# Patient Record
Sex: Male | Born: 1970 | Race: White | Hispanic: No | Marital: Married | Smoking: Never smoker
Health system: Southern US, Community
[De-identification: ages and names within clinical notes are randomized; demographics above are authoritative.]

## PROBLEM LIST (undated history)

## (undated) DIAGNOSIS — K219 Gastro-esophageal reflux disease without esophagitis: Secondary | ICD-10-CM

## (undated) DIAGNOSIS — T7840XA Allergy, unspecified, initial encounter: Secondary | ICD-10-CM

## (undated) DIAGNOSIS — J302 Other seasonal allergic rhinitis: Secondary | ICD-10-CM

## (undated) HISTORY — DX: Allergy, unspecified, initial encounter: T78.40XA

## (undated) HISTORY — PX: NO PAST SURGERIES: SHX2092

## (undated) HISTORY — DX: Gastro-esophageal reflux disease without esophagitis: K21.9

---

## 2011-08-14 ENCOUNTER — Emergency Department
Admission: EM | Admit: 2011-08-14 | Discharge: 2011-08-14 | Disposition: A | Discharge status: Home / Self Care | Payer: Self-pay | Source: Home / Self Care | Attending: Family Medicine | Admitting: Family Medicine

## 2011-08-14 ENCOUNTER — Encounter: Payer: Self-pay | Admitting: Emergency Medicine

## 2011-08-14 DIAGNOSIS — J209 Acute bronchitis, unspecified: Secondary | ICD-10-CM

## 2011-08-14 MED ORDER — BENZONATATE 200 MG PO CAPS: 200.0000 mg | ORAL_CAPSULE | Freq: Every day | ORAL | Status: AC

## 2011-08-14 MED ORDER — CLARITHROMYCIN 500 MG PO TABS: 500.0000 mg | ORAL_TABLET | Freq: Two times a day (BID) | ORAL | Status: AC

## 2011-08-14 NOTE — Discharge Instructions (Signed)
Continue plain Mucinex (guaifenesin) for cough and congestion. May add Sudafed for nasal congestion.  Increase fluid intake, rest. May use Afrin nasal spray (or generic oxymetazoline) twice daily for about 5 days.  Also recommend using saline nasal spray several times daily and saline nasal irrigation (AYR is a common brand) Stop all antihistamines for now, and other non-prescription cough/cold preparations. Recommend a Tdap when well.  Follow-up with family doctor if not improving about 5 days.

## 2011-08-14 NOTE — ED Provider Notes (Signed)
History     CSN: [id number]  Arrival date & time 08/14/11  4098   First MD Initiated Contact with Patient 08/14/11 1055      Chief Complaint  Patient presents with  . Nasal Congestion  . Cough      HPI Comments: Patient complains of approximately 1.5 week history of gradually progressive URI symptoms beginning with a mild sore throat (now improved), followed by progressive nasal congestion.  A cough started about 7 days ago.  Complains of fatigue but no myalgias.  Cough is now worse at night and generally non-productive during the day.  There has been no pleuritic pain, shortness of breath, or wheezes.  He now continues to have fevers, chills, and sweats.  The history is provided by the patient.    History reviewed. No pertinent past medical history.  History reviewed. No pertinent past surgical history.  Family History  Problem Relation Age of Onset  . Heart failure Mother   . Heart failure Father   . Heart failure Brother     History  Substance Use Topics  . Smoking status: Never Smoker   . Smokeless tobacco: Not on file  . Alcohol Use: No      Review of Systems + sore throat + hoarsemess + cough No pleuritic pain No wheezing + nasal congestion + post-nasal drainage No sinus pain/pressure No itchy/red eyes No earache No hemoptysis No SOB + fever, + chills No nausea No vomiting No abdominal pain No diarrhea No urinary symptoms No skin rashes + fatigue No myalgias + headache Used OTC meds without relief  Allergies  Review of patient's allergies indicates no known allergies.  Home Medications   Current Outpatient Rx  Name Route Sig Dispense Refill  . BENZONATATE 200 MG PO CAPS Oral Take 1 capsule (200 mg total) by mouth at bedtime. Take as needed for cough 12 capsule 0  . CLARITHROMYCIN 500 MG PO TABS Oral Take 1 tablet (500 mg total) by mouth 2 (two) times daily. Take for one week 14 tablet 0    BP 113/80  Pulse 72  Temp(Src) 100 F (37.8  C) (Oral)  Resp 18  Ht 5\' 9"  (1.753 m)  Wt 197 lb (89.359 kg)  BMI 29.09 kg/m2  SpO2 98%  Physical Exam Nursing notes and Vital Signs reviewed. Appearance:  Patient appears healthy, stated age, and in no acute distress Eyes:  Pupils are equal, round, and reactive to light and accomodation.  Extraocular movement is intact.  Conjunctivae are not inflamed  Ears:  Canals normal.  Tympanic membranes normal.  Nose:  Mildly congested turbinates.  No sinus tenderness.    Pharynx:  Normal Neck:  Supple.  Slightly tender shotty posterior nodes are palpated bilaterally  Lungs:  Clear to auscultation.  Breath sounds are equal.  Heart:  Regular rate and rhythm without murmurs, rubs, or gallops.  Abdomen:  Nontender without masses or hepatosplenomegaly.  Bowel sounds are present.  No CVA or flank tenderness.  Extremities:  No edema.  No calf tenderness Skin:  No rash present.   ED Course  Procedures none      1. Acute bronchitis       MDM  Begin Biaxin, and Tessalon at bedtime.  Continue plain Mucinex (guaifenesin) for cough and congestion. May add Sudafed for nasal congestion.  Increase fluid intake, rest. May use Afrin nasal spray (or generic oxymetazoline) twice daily for about 5 days.  Also recommend using saline nasal spray several times daily and saline  nasal irrigation (AYR is a common brand) Stop all antihistamines for now, and other non-prescription cough/cold preparations. Recommend a Tdap when well.  Follow-up with family doctor if not improving about 5 days.       Donna Christen, MD 08/16/11 1719

## 2011-08-14 NOTE — ED Notes (Signed)
Only taking Mucinex.

## 2011-08-14 NOTE — ED Notes (Signed)
Congestion and cough with fever x 4 days. No Flu vaccination this season.

## 2012-09-13 ENCOUNTER — Emergency Department
Admission: EM | Admit: 2012-09-13 | Discharge: 2012-09-13 | Disposition: A | Discharge status: Home / Self Care | Payer: BC Managed Care – PPO | Source: Home / Self Care | Attending: Family Medicine | Admitting: Family Medicine

## 2012-09-13 ENCOUNTER — Encounter: Payer: Self-pay | Admitting: *Deleted

## 2012-09-13 DIAGNOSIS — J069 Acute upper respiratory infection, unspecified: Secondary | ICD-10-CM

## 2012-09-13 HISTORY — DX: Other seasonal allergic rhinitis: J30.2

## 2012-09-13 MED ORDER — AMOXICILLIN 875 MG PO TABS: 875.0000 mg | ORAL_TABLET | Freq: Two times a day (BID) | ORAL | Status: DC

## 2012-09-13 MED ORDER — PREDNISONE 20 MG PO TABS: 20.0000 mg | ORAL_TABLET | Freq: Two times a day (BID) | ORAL | Status: DC

## 2012-09-13 NOTE — ED Provider Notes (Signed)
History     CSN: [id number]  Arrival date & time 09/13/12  1351   First MD Initiated Contact with Patient 09/13/12 1450      Chief Complaint  Patient presents with  . Sinus Problem      HPI Comments: The patient has a history of seasonal rhinitis.  About 1.5 weeks ago he developed increased sinus congestion that was followed by a mild sore throat.  He has been fatigued.  About 4 days ago he developed a mild productive cough.  His sinus congestion is worse at night.  The history is provided by the patient and the spouse.    Past Medical History  Diagnosis Date  . Seasonal allergies     History reviewed. No pertinent past surgical history.  Family History  Problem Relation Age of Onset  . Heart failure Mother   . Heart failure Father   . Heart failure Brother     History  Substance Use Topics  . Smoking status: Never Smoker   . Smokeless tobacco: Never Used  . Alcohol Use: No      Review of Systems + sore throat, resolved + cough No pleuritic pain No wheezing + nasal congestion + post-nasal drainage + sinus pain/pressure No itchy/red eyes No earache No hemoptysis No SOB No fever/chills No nausea No vomiting No abdominal pain No diarrhea No urinary symptoms No skin rashes + fatigue No myalgias No headache Used OTC meds without relief  Allergies  Review of patient's allergies indicates no known allergies.  Home Medications   Current Outpatient Rx  Name  Route  Sig  Dispense  Refill  . cetirizine (ZYRTEC) 10 MG tablet   Oral   Take 10 mg by mouth daily.         . ranitidine (ZANTAC) 150 MG tablet   Oral   Take 150 mg by mouth 2 (two) times daily.         Marland Kitchen amoxicillin (AMOXIL) 875 MG tablet   Oral   Take 1 tablet (875 mg total) by mouth 2 (two) times daily.   20 tablet   0   . predniSONE (DELTASONE) 20 MG tablet   Oral   Take 1 tablet (20 mg total) by mouth 2 (two) times daily. Take with food.   10 tablet   0     BP 122/78   Pulse 73  Temp(Src) 98.2 F (36.8 C) (Oral)  Resp 14  Ht 5\' 9"  (1.753 m)  Wt 188 lb (85.276 kg)  BMI 27.75 kg/m2  SpO2 98%  Physical Exam Nursing notes and Vital Signs reviewed. Appearance:  Patient appears healthy, stated age, and in no acute distress Eyes:  Pupils are equal, round, and reactive to light and accomodation.  Extraocular movement is intact.  Conjunctivae are not inflamed  Ears:  Canals normal.  Tympanic membranes normal.  Nose:  Mildly congested turbinates.   Maxillary sinus tenderness is present.  Pharynx:  Normal Neck:  Supple.  Slightly tender shotty posterior nodes are palpated bilaterally  Lungs:  Clear to auscultation.  Breath sounds are equal.  Heart:  Regular rate and rhythm without murmurs, rubs, or gallops.  Abdomen:  Nontender without masses or hepatosplenomegaly.  Bowel sounds are present.  No CVA or flank tenderness.  Extremities:  No edema.  No calf tenderness Skin:  No rash present.   ED Course  Procedures  none      1. Acute upper respiratory infections of unspecified site; suspect secondary bacterial sinusitis  MDM  Begin prednisone burst, and amoxicillin for 10 days. Take Mucinex D (guaifenesin with decongestant) twice daily for congestion.  Increase fluid intake, rest. May use Afrin nasal spray (or generic oxymetazoline) twice daily for about 5 days.  Also recommend using saline nasal spray several times daily and saline nasal irrigation (AYR is a common brand) Stop all antihistamines for now, and other non-prescription cough/cold preparations. May take Delsym Cough Suppressant at bedtime for nighttime cough.  Follow-up with family doctor if not improving 7 to 10 days.         Lattie Haw, MD 09/13/12 [phone]

## 2012-09-13 NOTE — ED Notes (Signed)
Brent Roth c/o sinus drainage and congestion, sneezing x 1 1/2 weeks. Taken tylenol sinus and alka seltzer sinus otc. Denies fever. He takes a zyrtec daily. He recently switched to Claritin, this is when his symptoms started.

## 2014-06-19 ENCOUNTER — Encounter: Payer: Self-pay | Admitting: *Deleted

## 2014-06-19 ENCOUNTER — Emergency Department (INDEPENDENT_AMBULATORY_CARE_PROVIDER_SITE_OTHER)
Admission: EM | Admit: 2014-06-19 | Discharge: 2014-06-19 | Disposition: A | Discharge status: Home / Self Care | Payer: Managed Care, Other (non HMO) | Source: Home / Self Care | Attending: Family Medicine | Admitting: Family Medicine

## 2014-06-19 DIAGNOSIS — J0100 Acute maxillary sinusitis, unspecified: Secondary | ICD-10-CM

## 2014-06-19 LAB — POCT RAPID STREP A (OFFICE): Rapid Strep A Screen: NEGATIVE

## 2014-06-19 MED ORDER — AMOXICILLIN 875 MG PO TABS: 875.0000 mg | ORAL_TABLET | Freq: Two times a day (BID) | ORAL | Status: DC

## 2014-06-19 NOTE — ED Notes (Signed)
Pt c/o nasal congestion, post nasal drip, and swelling in throat x 1 wk. Denies fever.

## 2014-06-19 NOTE — ED Provider Notes (Signed)
CSN: [id number]     Arrival date & time 06/19/14  1329 History   First MD Initiated Contact with Patient 06/19/14 1358     Chief Complaint  Patient presents with  . Nasal Congestion      HPI Comments: Patient developed sinus congestion about two weeks ago but did not feel ill.  Over the past week he has had increased post-nasal drainage, sinus pressure, facial pressure, and mild sore throat.  No cough.  No fevers, chills, and sweats   The history is provided by the patient.    Past Medical History  Diagnosis Date  . Seasonal allergies    History reviewed. No pertinent past surgical history. Family History  Problem Relation Age of Onset  . Heart failure Mother   . Heart failure Father   . Heart failure Brother    History  Substance Use Topics  . Smoking status: Former Games developermoker  . Smokeless tobacco: Never Used  . Alcohol Use: Yes    Review of Systems + sore throat No cough No pleuritic pain No wheezing + nasal congestion + post-nasal drainage + sinus pain/pressure No itchy/red eyes No earache No hemoptysis No SOB No fever/chills No nausea No vomiting No abdominal pain No diarrhea No urinary symptoms No skin rash No fatigue No myalgias No headache Used OTC meds without relief  Allergies  Review of patient's allergies indicates no known allergies.  Home Medications   Prior to Admission medications   Medication Sig Start Date End Date Taking? Authorizing Provider  amoxicillin (AMOXIL) 875 MG tablet Take 1 tablet (875 mg total) by mouth 2 (two) times daily. 06/19/14   Lattie HawStephen A Javion Holmer, MD  cetirizine (ZYRTEC) 10 MG tablet Take 10 mg by mouth daily.    Historical Provider, MD  ranitidine (ZANTAC) 150 MG tablet Take 150 mg by mouth 2 (two) times daily.    Historical Provider, MD   BP 127/70 mmHg  Pulse 72  Temp(Src) 98.1 F (36.7 C) (Oral)  Resp 16  Ht 5\' 9"  (1.753 m)  Wt 185 lb (83.915 kg)  BMI 27.31 kg/m2  SpO2 100% Physical Exam Nursing notes and Vital  Signs reviewed. Appearance:  Patient appears stated age, and in no acute distress Eyes:  Pupils are equal, round, and reactive to light and accomodation.  Extraocular movement is intact.  Conjunctivae are not inflamed  Ears:  Canals normal.  Tympanic membranes normal.  Nose:  Congested turbinates.  Maxillary sinus tenderness is present.  Pharynx:  Normal Neck:  Supple.  No adenopathy Lungs:  Clear to auscultation.  Breath sounds are equal.  Heart:  Regular rate and rhythm without murmurs, rubs, or gallops.  Abdomen:  Nontender without masses or hepatosplenomegaly.  Bowel sounds are present.  No CVA or flank tenderness.  Extremities:  No edema.  No calf tenderness Skin:  No rash present.   ED Course  Procedures  None   Labs Reviewed  POCT RAPID STREP A (OFFICE) negative      MDM   1. Acute maxillary sinusitis, recurrence not specified    Begin amoxicillin 875mg  BID Take plain Mucinex (1200 mg guaifenesin) twice daily for cough and congestion.  Add Pseudoephedrine for sinus congestion.   Increase fluid intake, rest. May use Afrin nasal spray (or generic oxymetazoline) twice daily for about 5 days.  Also recommend using saline nasal spray several times daily and saline nasal irrigation (AYR is a common brand) Try warm salt water gargles for sore throat.  Stop all antihistamines for  now, and other non-prescription cough/cold preparations. May take Ibuprofen , 4 tabs every 8 hours with food for sore throat. Followup with Family Doctor if not improved in 10 days.     Lattie Haw, MD 06/22/14 [phone]

## 2014-06-19 NOTE — Discharge Instructions (Signed)
Take plain Mucinex (1200 mg guaifenesin) twice daily for cough and congestion.  Add Pseudoephedrine for sinus congestion.   Increase fluid intake, rest. May use Afrin nasal spray (or generic oxymetazoline) twice daily for about 5 days.  Also recommend using saline nasal spray several times daily and saline nasal irrigation (AYR is a common brand) Try warm salt water gargles for sore throat.  Stop all antihistamines for now, and other non-prescription cough/cold preparations. May take Ibuprofen 200mg , 4 tabs every 8 hours with food for sore throat.

## 2014-06-25 ENCOUNTER — Telehealth: Payer: Self-pay | Admitting: Emergency Medicine

## 2014-06-25 MED ORDER — AZITHROMYCIN 250 MG PO TABS: ORAL_TABLET | ORAL | Status: DC

## 2014-08-10 ENCOUNTER — Encounter: Payer: Self-pay | Admitting: *Deleted

## 2014-08-10 ENCOUNTER — Emergency Department (INDEPENDENT_AMBULATORY_CARE_PROVIDER_SITE_OTHER)
Admission: EM | Admit: 2014-08-10 | Discharge: 2014-08-10 | Disposition: A | Discharge status: Home / Self Care | Payer: Managed Care, Other (non HMO) | Source: Home / Self Care | Attending: Family Medicine | Admitting: Family Medicine

## 2014-08-10 DIAGNOSIS — J029 Acute pharyngitis, unspecified: Secondary | ICD-10-CM | POA: Diagnosis not present

## 2014-08-10 MED ORDER — AZITHROMYCIN 250 MG PO TABS
ORAL_TABLET | ORAL | Status: DC
Start: 2014-08-10 — End: 2014-08-14

## 2014-08-10 NOTE — ED Notes (Signed)
Pt complains of sore throat described as feeling swollen and L ear pain for 5 days.  Pt has been exposed to strep by 2 kids in the last week.  Says he has a little bit of congestion.

## 2014-08-10 NOTE — ED Provider Notes (Signed)
CSN: [id number]     Arrival date & time 08/10/14  [id number] History   First MD Initiated Contact with Patient 08/10/14 1013     Chief Complaint  Patient presents with  . Otalgia    L  . Sore Throat      HPI Comments: Patient complains of mild sore throat, sinus congestion, and mild left earache for about five days. His son and a young friend have had strep throat.  The history is provided by the patient.    Past Medical History  Diagnosis Date  . Seasonal allergies    History reviewed. No pertinent past surgical history. Family History  Problem Relation Age of Onset  . Heart failure Mother   . Heart failure Father   . Heart failure Brother    History  Substance Use Topics  . Smoking status: Former Games developermoker  . Smokeless tobacco: Never Used  . Alcohol Use: Yes    Review of Systems + mild sore throat No cough No pleuritic pain No wheezing + nasal congestion + post-nasal drainage No sinus pain/pressure No itchy/red eyes ? Left earache No hemoptysis No SOB No fever/chills No nausea No vomiting No abdominal pain No diarrhea No urinary symptoms No skin rash No fatigue No myalgias No headache Used OTC meds without relief  Allergies  Amoxicillin  Home Medications   Prior to Admission medications   Medication Sig Start Date End Date Taking? Authorizing Provider  azithromycin (ZITHROMAX Z-PAK) 250 MG tablet Take 2 tablets on day one, then 1 tablet daily on days 2 through 5 (Rx void after 08/17/14) 08/10/14   Lattie HawStephen A Aldwin Micalizzi, MD  cetirizine (ZYRTEC) 10 MG tablet Take 10 mg by mouth daily.    Historical Provider, MD  ranitidine (ZANTAC) 150 MG tablet Take 150 mg by mouth 2 (two) times daily.    Historical Provider, MD   BP 125/84 mmHg  Pulse 63  Temp(Src) 98 F (36.7 C) (Oral)  Ht 5\' 11"  (1.803 m)  Wt 188 lb 8 oz (85.503 kg)  BMI 26.30 kg/m2  SpO2 99% Physical Exam Nursing notes and Vital Signs reviewed. Appearance:  Patient appears stated age, and in no acute  distress Eyes:  Pupils are equal, round, and reactive to light and accomodation.  Extraocular movement is intact.  Conjunctivae are not inflamed  Ears:  Canals normal.  Tympanic membranes normal.  Nose:  Mildly congested turbinates.  No sinus tenderness.    Pharynx:  Minimal erythema Neck:  Supple.  No adenopathy Lungs:  Clear to auscultation.  Breath sounds are equal.  Heart:  Regular rate and rhythm without murmurs, rubs, or gallops.  Abdomen:  Nontender without masses or hepatosplenomegaly.  Bowel sounds are present.  No CVA or flank tenderness.  Extremities:  No edema.  No calf tenderness Skin:  No rash present.   ED Course  Procedures  None    Labs Reviewed  STREP A DNA PROBE  POCT RAPID STREP A (OFFICE) negative         MDM   1. Acute pharyngitis, unspecified pharyngitis type; suspect early viral URI    Throat culture pending. If throat culture is positive, begin Azithromycin (Given a prescription to hold, with an expiration date)    If cold symptoms develop, begin the following: Take plain guaifenesin (600mg  or1200mg  extended release tabs such as Mucinex) twice daily, with plenty of water, for cough and congestion.  May add Pseudoephedrine for sinus congestion.  Get adequate rest.   May use Afrin nasal  spray (or generic oxymetazoline) twice daily for about 5 days.  Also recommend using saline nasal spray several times daily and saline nasal irrigation (AYR is a common brand).  Use Flonase nasal spray each morning after using Afrin nasal spray and saline nasal irrigation. Try warm salt water gargles for sore throat.  Stop all antihistamines for now, and other non-prescription cough/cold preparations.   Follow-up with family doctor if not improving about10 days.     Lattie Haw, MD 08/10/14 2029

## 2014-08-10 NOTE — Discharge Instructions (Signed)
If throat culture is positive, begin Azithromycin   If cold symptoms develop, begin the following: Take plain guaifenesin (600mg  or1200mg  extended release tabs such as Mucinex) twice daily, with plenty of water, for cough and congestion.  May add Pseudoephedrine for sinus congestion.  Get adequate rest.   May use Afrin nasal spray (or generic oxymetazoline) twice daily for about 5 days.  Also recommend using saline nasal spray several times daily and saline nasal irrigation (AYR is a common brand).  Use Flonase nasal spray each morning after using Afrin nasal spray and saline nasal irrigation. Try warm salt water gargles for sore throat.  Stop all antihistamines for now, and other non-prescription cough/cold preparations.   Follow-up with family doctor if not improving about10 days.    Salt Water Gargle This solution will help make your mouth and throat feel better. HOME CARE INSTRUCTIONS   Mix 1 teaspoon of salt in 8 ounces of warm water.  Gargle with this solution as much or often as you need or as directed. Swish and gargle gently if you have any sores or wounds in your mouth.  Do not swallow this mixture. Document Released: 03/04/2004 Document Revised: 08/23/2011 Document Reviewed: 07/26/2008 [facility] Patient Information 2015 [employer], [place]. This information is not intended to replace advice given to you by your health care provider. Make sure you discuss any questions you have with your health care provider.

## 2014-08-11 LAB — STREP A DNA PROBE: GASP: NEGATIVE

## 2014-08-14 ENCOUNTER — Emergency Department (INDEPENDENT_AMBULATORY_CARE_PROVIDER_SITE_OTHER): Payer: Managed Care, Other (non HMO)

## 2014-08-14 ENCOUNTER — Encounter: Payer: Self-pay | Admitting: *Deleted

## 2014-08-14 ENCOUNTER — Emergency Department (INDEPENDENT_AMBULATORY_CARE_PROVIDER_SITE_OTHER)
Admission: EM | Admit: 2014-08-14 | Discharge: 2014-08-14 | Disposition: A | Discharge status: Home / Self Care | Payer: Managed Care, Other (non HMO) | Source: Home / Self Care | Attending: Family Medicine | Admitting: Family Medicine

## 2014-08-14 DIAGNOSIS — M25521 Pain in right elbow: Secondary | ICD-10-CM

## 2014-08-14 DIAGNOSIS — R0789 Other chest pain: Secondary | ICD-10-CM

## 2014-08-14 DIAGNOSIS — M940 Chondrocostal junction syndrome [Tietze]: Secondary | ICD-10-CM

## 2014-08-14 MED ORDER — MELOXICAM 15 MG PO TABS
15.0000 mg | ORAL_TABLET | Freq: Every day | ORAL | Status: DC
Start: 2014-08-14 — End: 2015-03-24

## 2014-08-14 NOTE — ED Notes (Signed)
Pt c/o RT elbow pain x 1-2 mths. Denies swelling, pain, or redness.

## 2014-08-14 NOTE — Discharge Instructions (Signed)
Apply ice pack to sternum for 15 to 20 minutes, 3 to 4 times daily  Continue until pain decreases.

## 2014-08-14 NOTE — ED Provider Notes (Signed)
CSN: [id number]     Arrival date & time 08/14/14  1003 History   First MD Initiated Contact with Patient 08/14/14 1111     Chief Complaint  Patient presents with  . Arm Pain      HPI Comments: Patient presents with two complaints: 1)  He has had pain in his right elbow with movement for one month.  He does not recall injury. 2)  He has had pain in his right upper anterior chest for about a month worse with chest movement and inspiration.  He denies cough.  No fevers, chills, and sweats.  No history of injury  Patient is a 44 y.o. male presenting with arm injury and chest pain. The history is provided by the patient.  Arm Injury Location:  Elbow Time since incident:  1 month Injury: no   Elbow location:  R elbow Pain details:    Quality:  Aching   Radiates to:  Does not radiate   Severity:  Mild   Onset quality:  Gradual   Duration:  1 month   Timing:  Constant   Progression:  Unchanged Chronicity:  New Handedness:  Right-handed Prior injury to area:  No Relieved by:  Nothing Worsened by:  Movement Ineffective treatments:  None tried Associated symptoms: no back pain, no decreased range of motion, no fever, no muscle weakness, no numbness, no stiffness, no swelling and no tingling   Chest Pain Pain location:  Substernal area Pain quality: aching   Pain radiates to:  Does not radiate Pain radiates to the back: no   Pain severity:  Mild Onset quality:  Gradual Duration:  1 month Timing:  Intermittent Progression:  Unchanged Chronicity:  New Context: breathing, lifting, movement and at rest   Relieved by:  Nothing Worsened by:  Coughing, movement and deep breathing Ineffective treatments:  None tried Associated symptoms: no abdominal pain, no back pain, no cough, no diaphoresis, no dysphagia, no fever, no heartburn, no nausea, no palpitations and no shortness of breath     Past Medical History  Diagnosis Date  . Seasonal allergies    History reviewed. No pertinent  past surgical history. Family History  Problem Relation Age of Onset  . Heart failure Mother   . Heart failure Father   . Heart failure Brother    History  Substance Use Topics  . Smoking status: Former Games developermoker  . Smokeless tobacco: Never Used  . Alcohol Use: Yes    Review of Systems  Constitutional: Negative for fever and diaphoresis.  HENT: Negative for trouble swallowing.   Respiratory: Negative for cough and shortness of breath.   Cardiovascular: Positive for chest pain. Negative for palpitations.  Gastrointestinal: Negative for heartburn, nausea and abdominal pain.  Musculoskeletal: Negative for back pain and stiffness.  All other systems reviewed and are negative.   Allergies  Amoxicillin  Home Medications   Prior to Admission medications   Medication Sig Start Date End Date Taking? Authorizing Provider  cetirizine (ZYRTEC) 10 MG tablet Take 10 mg by mouth daily.   Yes Historical Provider, MD  ranitidine (ZANTAC) 150 MG tablet Take 150 mg by mouth 2 (two) times daily.   Yes Historical Provider, MD  meloxicam (MOBIC) 15 MG tablet Take 1 tablet (15 mg total) by mouth daily. Take with food each morning 08/14/14   Lattie HawStephen A Anzel Kearse, MD   BP 137/87 mmHg  Pulse 65  Temp(Src) 97.7 F (36.5 C) (Oral)  Resp 18  Ht 5\' 11"  (1.803 m)  Wt 187 lb (84.823 kg)  BMI 26.09 kg/m2  SpO2 99% Physical Exam  Constitutional: He is oriented to person, place, and time. He appears well-developed and well-nourished. No distress.  HENT:  Head: Normocephalic.  Mouth/Throat: Oropharynx is clear and moist.  Eyes: Conjunctivae are normal. Pupils are equal, round, and reactive to light.  Neck: Neck supple.  Cardiovascular: Normal heart sounds.   Pulmonary/Chest: Breath sounds normal. He exhibits tenderness.    There is tenderness to palpation right upper anterior chest with resisted flexion of pectoralis muscle as noted on diagram.    Abdominal: There is no tenderness.  Musculoskeletal:        Right elbow: He exhibits normal range of motion, no swelling, no effusion, no deformity and no laceration. No tenderness found.  Right elbow has full range of motion without tenderness to palpation.  Distal neurovascular function is intact.   Lymphadenopathy:    He has no cervical adenopathy.  Neurological: He is alert and oriented to person, place, and time.  Skin: Skin is warm and dry. No rash noted.  Nursing note and vitals reviewed.   ED Course  Procedures  none    Imaging Review Dg Chest 2 View  08/14/2014   CLINICAL DATA:  Right upper chest pain and tenderness for 1 month, no known injury  EXAM: CHEST  2 VIEW  COMPARISON:  None.  FINDINGS: Cardiomediastinal silhouette is unremarkable. No acute infiltrate or pleural effusion. No pulmonary edema. Mild hyperinflation. Bony thorax is unremarkable.  IMPRESSION: No active cardiopulmonary disease.   Electronically Signed   By: Natasha Mead M.D.   On: 08/14/2014 11:41   Dg Elbow 2 Views Right  08/14/2014   CLINICAL DATA:  Right elbow pain and tenderness for 1 month  EXAM: RIGHT ELBOW - 2 VIEW  COMPARISON:  None.  FINDINGS: Two views of the right elbow submitted. No acute fracture or subluxation. No posterior fat pad sign.  IMPRESSION: Negative.   Electronically Signed   By: Natasha Mead M.D.   On: 08/14/2014 11:40     MDM   1. Costochondritis   2. 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 Apply ice pack to sternum for 15 to 20 minutes, 3 to 4 times daily  Continue until pain decreases.  Followup with Dr. Rodney [facility] ([facility]) if not improving about two weeks.     Lattie Haw, MD 08/16/14 [phone]

## 2015-03-01 ENCOUNTER — Emergency Department (INDEPENDENT_AMBULATORY_CARE_PROVIDER_SITE_OTHER)
Admission: EM | Admit: 2015-03-01 | Discharge: 2015-03-01 | Disposition: A | Discharge status: Home / Self Care | Payer: BLUE CROSS/BLUE SHIELD | Source: Home / Self Care | Attending: Emergency Medicine | Admitting: Emergency Medicine

## 2015-03-01 ENCOUNTER — Encounter: Payer: Self-pay | Admitting: Emergency Medicine

## 2015-03-01 DIAGNOSIS — R300 Dysuria: Secondary | ICD-10-CM | POA: Diagnosis not present

## 2015-03-01 DIAGNOSIS — R319 Hematuria, unspecified: Secondary | ICD-10-CM | POA: Diagnosis not present

## 2015-03-01 LAB — POCT URINALYSIS DIP (MANUAL ENTRY)
Glucose, UA: NEGATIVE
NITRITE UA: POSITIVE — AB
PH UA: 5.5 (ref 5–8)
Protein Ur, POC: 100 — AB
Spec Grav, UA: >=1.03 (ref 1.005–1.03)
UROBILINOGEN UA: 0.2 (ref 0–1)

## 2015-03-01 MED ORDER — CIPROFLOXACIN HCL 500 MG PO TABS: 500.0000 mg | ORAL_TABLET | Freq: Two times a day (BID) | ORAL | Status: DC

## 2015-03-01 NOTE — ED Notes (Signed)
Patient C/O dysuria , frequency with nausea since yesterday.

## 2015-03-01 NOTE — ED Provider Notes (Signed)
CSN: [id number]     Arrival date & time 03/01/15  1430 History   First MD Initiated Contact with Patient 03/01/15 1500     Chief Complaint  Patient presents with  . Dysuria   (Consider location/radiation/quality/duration/timing/severity/associated sxs/prior Treatment) Patient is a 44 y.o. male presenting with dysuria. The history is provided by the patient. No language interpreter was used.  Dysuria This is a new problem. The current episode started yesterday. The problem occurs constantly. The problem has been gradually worsening. Pertinent negatives include no abdominal pain. Nothing aggravates the symptoms. Nothing relieves the symptoms. He has tried nothing for the symptoms. The treatment provided no relief.  nausea, blood in urine burning with urination  Past Medical History  Diagnosis Date  . Seasonal allergies    History reviewed. No pertinent past surgical history. Family History  Problem Relation Age of Onset  . Heart failure Mother   . Heart failure Father   . Heart failure Brother    Social History  Substance Use Topics  . Smoking status: Former Games developer  . Smokeless tobacco: Never Used  . Alcohol Use: No    Review of Systems  Gastrointestinal: Negative for abdominal pain.  Genitourinary: Positive for dysuria.  All other systems reviewed and are negative.   Allergies  Amoxicillin  Home Medications   Prior to Admission medications   Medication Sig Start Date End Date Taking? Authorizing Provider  cetirizine (ZYRTEC) 10 MG tablet Take 10 mg by mouth daily.    Historical Provider, MD  ciprofloxacin (CIPRO) 500 MG tablet Take 1 tablet (500 mg total) by mouth 2 (two) times daily. 03/01/15   Elson Areas, PA-C  meloxicam (MOBIC) 15 MG tablet Take 1 tablet (15 mg total) by mouth daily. Take with food each morning 08/14/14   Lattie Haw, MD  ranitidine (ZANTAC) 150 MG tablet Take 150 mg by mouth 2 (two) times daily.    Historical Provider, MD   Meds Ordered and  Administered this Visit  Medications - No data to display  BP 141/91 mmHg  Pulse 70  Temp(Src) 98.2 F (36.8 C) (Oral)  Resp 16  Ht  (1.753 m)  Wt 192 lb 8 oz (87.317 kg)  BMI 28.41 kg/m2  SpO2 98% No data found.   Physical Exam  Constitutional: He is oriented to person, place, and time. He appears well-developed and well-nourished.  HENT:  Head: Normocephalic.  Eyes: EOM are normal.  Neck: Normal range of motion.  Cardiovascular: Normal rate.   Pulmonary/Chest: Effort normal.  Abdominal: He exhibits no distension.  Musculoskeletal: Normal range of motion.  Neurological: He is alert and oriented to person, place, and time.  Psychiatric: He has a normal mood and affect.  Nursing note and vitals reviewed.   ED Course no std risk, no flank pain  Procedures (including critical care time)  Labs Review Labs Reviewed  POCT URINALYSIS DIP (MANUAL ENTRY)   Leukocytes, nitrate and blood Imaging Review No results found.   Visual Acuity Review  Right Eye Distance:   Left Eye Distance:   Bilateral Distance:    Right Eye Near:   Left Eye Near:    Bilateral Near:         MDM   1. Dysuria   2. Hematuria    Pt does not have primary care.   Pt advised to schedule to see Dr. Denyse Amass for evaluation Cipro avs    Elson Areas, PA-C 03/01/15 1626

## 2015-03-01 NOTE — Discharge Instructions (Signed)
Dysuria  Dysuria is the medical term for pain with urination. There are many causes for dysuria, but urinary tract infection is the most common. If a urinalysis was performed it can show that there is a urinary tract infection. A urine culture confirms that you or your child is sick. You will need to follow up with a healthcare provider because:  · If a urine culture was done you will need to know the culture results and treatment recommendations.  · If the urine culture was positive, you or your child will need to be put on antibiotics or know if the antibiotics prescribed are the right antibiotics for your urinary tract infection.  · If the urine culture is negative (no urinary tract infection), then other causes may need to be explored or antibiotics need to be stopped.  Today laboratory work may have been done and there does not seem to be an infection. If cultures were done they will take at least 24 to 48 hours to be completed.  Today x-rays may have been taken and they read as normal. No cause can be found for the problems. The x-rays may be re-read by a radiologist and you will be contacted if additional findings are made.  You or your child may have been put on medications to help with this problem until you can see your primary caregiver. If the problems get better, see your primary caregiver if the problems return. If you were given antibiotics (medications which kill germs), take all of the mediations as directed for the full course of treatment.   If laboratory work was done, you need to find the results. Leave a telephone number where you can be reached. If this is not possible, make sure you find out how you are to get test results.  HOME CARE INSTRUCTIONS   · Drink lots of fluids. For adults, drink eight, 8 ounce glasses of clear juice or water a day. For children, replace fluids as suggested by your caregiver.  · Empty the bladder often. Avoid holding urine for long periods of time.  · After a bowel  movement, women should cleanse front to back, using each tissue only once.  · Empty your bladder before and after sexual intercourse.  · Take all the medicine given to you until it is gone. You may feel better in a few days, but TAKE ALL MEDICINE.  · Avoid caffeine, tea, alcohol and carbonated beverages, because they tend to irritate the bladder.  · In men, alcohol may irritate the prostate.  · Only take over-the-counter or prescription medicines for pain, discomfort, or fever as directed by your caregiver.  · If your caregiver has given you a follow-up appointment, it is very important to keep that appointment. Not keeping the appointment could result in a chronic or permanent injury, pain, and disability. If there is any problem keeping the appointment, you must call back to this facility for assistance.  SEEK IMMEDIATE MEDICAL CARE IF:   · Back pain develops.  · A fever develops.  · There is nausea (feeling sick to your stomach) or vomiting (throwing up).  · Problems are no better with medications or are getting worse.  MAKE SURE YOU:   · Understand these instructions.  · Will watch your condition.  · Will get help right away if you are not doing well or get worse.  Document Released: 02/27/2004 Document Revised: 08/23/2011 Document Reviewed: 01/04/2008  ExitCare® Patient Information ©2015 [employer]. This information is not   intended to replace advice given to you by your health care provider. Make sure you discuss any questions you have with your health care provider.  Urinary Tract Infection  Urinary tract infections (UTIs) can develop anywhere along your urinary tract. Your urinary tract is your body's drainage system for removing wastes and extra water. Your urinary tract includes two kidneys, two ureters, a bladder, and a urethra. Your kidneys are a pair of bean-shaped organs. Each kidney is about the size of your fist. They are located below your ribs, one on each side of your spine.  CAUSES  Infections  are caused by microbes, which are microscopic organisms, including fungi, viruses, and bacteria. These organisms are so small that they can only be seen through a microscope. Bacteria are the microbes that most commonly cause UTIs.  SYMPTOMS   Symptoms of UTIs may vary by age and gender of the patient and by the location of the infection. Symptoms in young women typically include a frequent and intense urge to urinate and a painful, burning feeling in the bladder or urethra during urination. Older women and men are more likely to be tired, shaky, and weak and have muscle aches and abdominal pain. A fever may mean the infection is in your kidneys. Other symptoms of a kidney infection include pain in your back or sides below the ribs, nausea, and vomiting.  DIAGNOSIS  To diagnose a UTI, your caregiver will ask you about your symptoms. Your caregiver also will ask to provide a urine sample. The urine sample will be tested for bacteria and white blood cells. White blood cells are made by your body to help fight infection.  TREATMENT   Typically, UTIs can be treated with medication. Because most UTIs are caused by a bacterial infection, they usually can be treated with the use of antibiotics. The choice of antibiotic and length of treatment depend on your symptoms and the type of bacteria causing your infection.  HOME CARE INSTRUCTIONS  · If you were prescribed antibiotics, take them exactly as your caregiver instructs you. Finish the medication even if you feel better after you have only taken some of the medication.  · Drink enough water and fluids to keep your urine clear or pale yellow.  · Avoid caffeine, tea, and carbonated beverages. They tend to irritate your bladder.  · Empty your bladder often. Avoid holding urine for long periods of time.  · Empty your bladder before and after sexual intercourse.  · After a bowel movement, women should cleanse from front to back. Use each tissue only once.  SEEK MEDICAL CARE  IF:   · You have back pain.  · You develop a fever.  · Your symptoms do not begin to resolve within 3 days.  SEEK IMMEDIATE MEDICAL CARE IF:   · You have severe back pain or lower abdominal pain.  · You develop chills.  · You have nausea or vomiting.  · You have continued burning or discomfort with urination.  MAKE SURE YOU:   · Understand these instructions.  · Will watch your condition.  · Will get help right away if you are not doing well or get worse.  Document Released: 03/10/2005 Document Revised: 11/30/2011 Document Reviewed: 07/09/2011  ExitCare® Patient Information ©2015 [employer]. This information is not intended to replace advice given to you by your health care provider. Make sure you discuss any questions you have with your health care provider.

## 2015-03-04 ENCOUNTER — Telehealth: Payer: Self-pay | Admitting: Emergency Medicine

## 2015-03-24 ENCOUNTER — Encounter: Payer: Self-pay | Admitting: *Deleted

## 2015-03-24 ENCOUNTER — Emergency Department (INDEPENDENT_AMBULATORY_CARE_PROVIDER_SITE_OTHER)
Admission: EM | Admit: 2015-03-24 | Discharge: 2015-03-24 | Disposition: A | Discharge status: Home / Self Care | Payer: 59 | Source: Home / Self Care | Attending: Family Medicine | Admitting: Family Medicine

## 2015-03-24 DIAGNOSIS — R319 Hematuria, unspecified: Secondary | ICD-10-CM

## 2015-03-24 DIAGNOSIS — R300 Dysuria: Secondary | ICD-10-CM | POA: Diagnosis not present

## 2015-03-24 DIAGNOSIS — N390 Urinary tract infection, site not specified: Secondary | ICD-10-CM

## 2015-03-24 LAB — POCT URINALYSIS DIP (MANUAL ENTRY)
Bilirubin, UA: NEGATIVE
GLUCOSE UA: NEGATIVE
Ketones, POC UA: NEGATIVE
NITRITE UA: POSITIVE — AB
PH UA: 5.5 (ref 5–8)
Spec Grav, UA: >=1.03 (ref 1.005–1.03)
UROBILINOGEN UA: 0.2 (ref 0–1)

## 2015-03-24 MED ORDER — CIPROFLOXACIN HCL 500 MG PO TABS: 500.0000 mg | ORAL_TABLET | Freq: Two times a day (BID) | ORAL | Status: DC

## 2015-03-24 NOTE — ED Notes (Signed)
Pt c/o dysuria, frequent urination and dark colored urine x 2 days. Denies fever.

## 2015-03-24 NOTE — Discharge Instructions (Signed)
Increase fluid intake. ° ° °Urinary Tract Infection °Urinary tract infections (UTIs) can develop anywhere along your urinary tract. Your urinary tract is your body's drainage system for removing wastes and extra water. Your urinary tract includes two kidneys, two ureters, a bladder, and a urethra. Your kidneys are a pair of bean-shaped organs. Each kidney is about the size of your fist. They are located below your ribs, one on each side of your spine. °CAUSES °Infections are caused by microbes, which are microscopic organisms, including fungi, viruses, and bacteria. These organisms are so small that they can only be seen through a microscope. Bacteria are the microbes that most commonly cause UTIs. °SYMPTOMS  °Symptoms of UTIs may vary by age and gender of the patient and by the location of the infection. Symptoms in young women typically include a frequent and intense urge to urinate and a painful, burning feeling in the bladder or urethra during urination. Older women and men are more likely to be tired, shaky, and weak and have muscle aches and abdominal pain. A fever may mean the infection is in your kidneys. Other symptoms of a kidney infection include pain in your back or sides below the ribs, nausea, and vomiting. °DIAGNOSIS °To diagnose a UTI, your caregiver will ask you about your symptoms. Your caregiver will also ask you to provide a urine sample. The urine sample will be tested for bacteria and white blood cells. White blood cells are made by your body to help fight infection. °TREATMENT  °Typically, UTIs can be treated with medication. Because most UTIs are caused by a bacterial infection, they usually can be treated with the use of antibiotics. The choice of antibiotic and length of treatment depend on your symptoms and the type of bacteria causing your infection. °HOME CARE INSTRUCTIONS °· If you were prescribed antibiotics, take them exactly as your caregiver instructs you. Finish the medication even  if you feel better after you have only taken some of the medication. °· Drink enough water and fluids to keep your urine clear or pale yellow. °· Avoid caffeine, tea, and carbonated beverages. They tend to irritate your bladder. °· Empty your bladder often. Avoid holding urine for long periods of time. °· Empty your bladder before and after sexual intercourse. °· After a bowel movement, women should cleanse from front to back. Use each tissue only once. °SEEK MEDICAL CARE IF:  °· You have back pain. °· You develop a fever. °· Your symptoms do not begin to resolve within 3 days. °SEEK IMMEDIATE MEDICAL CARE IF:  °· You have severe back pain or lower abdominal pain. °· You develop chills. °· You have nausea or vomiting. °· You have continued burning or discomfort with urination. °MAKE SURE YOU:  °· Understand these instructions. °· Will watch your condition. °· Will get help right away if you are not doing well or get worse. °  °This information is not intended to replace advice given to you by your health care provider. Make sure you discuss any questions you have with your health care provider. °  °Document Released: 03/10/2005 Document Revised: 02/19/2015 Document Reviewed: 07/09/2011 °Elsevier Interactive Patient Education ©2016 [employer] ° ° ° °

## 2015-03-24 NOTE — ED Provider Notes (Signed)
CSN: [id number]     Arrival date & time 03/24/15  1806 History   First MD Initiated Contact with Patient 03/24/15 1814     Chief Complaint  Patient presents with  . Dysuria     HPI Comments: Patient complains of two day history of dysuria and frequency.  He has also noted a trace of blood in his urine.  No urethral discharge or testicular pain.  No abdominal pain.  No fevers, chills, and sweats.  He had a similar episode about one month ago.   Patient is a 44 y.o. male presenting with dysuria. The history is provided by the patient and the spouse.  Dysuria This is a recurrent problem. The current episode started 2 days ago. The problem occurs constantly. The problem has not changed since onset.Pertinent negatives include no abdominal pain. Nothing aggravates the symptoms. Nothing relieves the symptoms. He has tried nothing for the symptoms.    Past Medical History  Diagnosis Date  . Seasonal allergies    History reviewed. No pertinent past surgical history. Family History  Problem Relation Age of Onset  . Heart failure Mother   . Heart failure Father   . Heart failure Brother    Social History  Substance Use Topics  . Smoking status: Former Games developer  . Smokeless tobacco: Never Used  . Alcohol Use: No    Review of Systems  Constitutional: Negative for fever, chills, diaphoresis and fatigue.  Gastrointestinal: Negative for abdominal pain.  Genitourinary: Positive for dysuria.  All other systems reviewed and are negative.   Allergies  Amoxicillin  Home Medications   Prior to Admission medications   Medication Sig Start Date End Date Taking? Authorizing Provider  esomeprazole (NEXIUM) 40 MG capsule Take 40 mg by mouth daily at 12 noon.   Yes Historical Provider, MD  cetirizine (ZYRTEC) 10 MG tablet Take 10 mg by mouth daily.    Historical Provider, MD  ciprofloxacin (CIPRO) 500 MG tablet Take 1 tablet (500 mg total) by mouth 2 (two) times daily. 03/24/15   Lattie Haw,  MD   Meds Ordered and Administered this Visit  Medications - No data to display  BP 135/86 mmHg  Pulse 76  Temp(Src) 98.3 F (36.8 C) (Oral)  Resp 16  Ht  (1.753 m)  Wt 194 lb (87.998 kg)  BMI 28.64 kg/m2  SpO2 98% No data found.   Physical Exam Nursing notes and Vital Signs reviewed. Appearance:  Patient appears stated age, and in no acute distress Eyes:  Pupils are equal, round, and reactive to light and accomodation.  Extraocular movement is intact.  Conjunctivae are not inflamed  Nose:  Normal Pharynx:  Normal Neck:  Supple.  No adenopathy Lungs:  Clear to auscultation.  Breath sounds are equal.  Moving air well. Heart:  Regular rate and rhythm without murmurs, rubs, or gallops.  Abdomen:  Nontender without masses or hepatosplenomegaly.  Bowel sounds are present.  No CVA or flank tenderness.  Extremities:  No edema.  No calf tenderness Skin:  No rash present.   ED Course  Procedures  None    Labs Reviewed  POCT URINALYSIS DIP (MANUAL ENTRY) - Abnormal; Notable for the following:    Clarity, UA cloudy (*)    Blood, UA moderate (*)    Protein Ur, POC =30 (*)    Nitrite, UA Positive (*)    Leukocytes, UA moderate (2+) (*)    All other components within normal limits  URINE CULTURE  MDM   1. Dysuria   2. Urinary tract infection with hematuria, site unspecified    Urine culture pending. Begin Cipro  BID for 10 days. Increase fluid intake. Recommend follow-up with urologist.    Lattie Haw, MD 03/28/15 1021

## 2015-03-26 ENCOUNTER — Telehealth: Payer: Self-pay | Admitting: *Deleted

## 2015-03-26 LAB — URINE CULTURE

## 2016-02-03 ENCOUNTER — Emergency Department (INDEPENDENT_AMBULATORY_CARE_PROVIDER_SITE_OTHER)
Admission: EM | Admit: 2016-02-03 | Discharge: 2016-02-03 | Disposition: A | Discharge status: Home / Self Care | Payer: 59 | Source: Home / Self Care | Attending: Family Medicine | Admitting: Family Medicine

## 2016-02-03 ENCOUNTER — Encounter: Payer: Self-pay | Admitting: *Deleted

## 2016-02-03 DIAGNOSIS — R42 Dizziness and giddiness: Secondary | ICD-10-CM

## 2016-02-03 DIAGNOSIS — H538 Other visual disturbances: Secondary | ICD-10-CM

## 2016-02-03 LAB — POCT FASTING CBG KUC MANUAL ENTRY: POCT Glucose (KUC): 98 mg/dL (ref 70–99)

## 2016-02-03 MED ORDER — MECLIZINE HCL 25 MG PO TABS: 25.0000 mg | ORAL_TABLET | Freq: Three times a day (TID) | ORAL | 0 refills | Status: DC | PRN

## 2016-02-03 NOTE — ED Triage Notes (Signed)
Pt c/o dizziness and blurry vision x 1 day. The dizziness is constant and is only relieved while sleeping. He reports a HA at the end of last week that has resolved. Reports the HA was like his normal HA's.

## 2016-02-03 NOTE — Discharge Instructions (Signed)
°  Meclizine is an antihistamine that may cause drowsiness. While taking, do not drink alcohol, drive, or perform any other activities that requires focus while taking these medications.

## 2016-02-03 NOTE — ED Provider Notes (Signed)
CSN: [id number]     Arrival date & time 02/03/16  1240 History   First MD Initiated Contact with Patient 02/03/16 1258     Chief Complaint  Patient presents with  . Dizziness   (Consider location/radiation/quality/duration/timing/severity/associated sxs/prior Treatment) HPI Brent Roth is a 45 y.o. male presenting to UC with wife with c/o dizziness and blurred vision that started yesterday, relieved by sleeping last night.  He also reports mild headache last week and earlier this morning, both resolved with BC powder.  He denies a "room spinning" sensation of dizziness but states the blurred vision is more concerning to him.  He notes it is worse when he turns his head from side to side. He has also had mild ear fullness. Denies fever, chills, congestion. Denies n/v/d. No hx of HTN. He does not wear glasses or contacts. No recent head injury. Denies numbness or pain in arms or legs.    Past Medical History:  Diagnosis Date  . Seasonal allergies    History reviewed. No pertinent surgical history. Family History  Problem Relation Age of Onset  . Heart failure Mother   . Heart failure Father   . Heart failure Brother    Social History  Substance Use Topics  . Smoking status: Former Games developermoker  . Smokeless tobacco: Never Used  . Alcohol use Yes    Review of Systems  Constitutional: Negative for chills and fever.  HENT: Positive for ear pain. Negative for congestion, facial swelling, sinus pressure, sneezing and sore throat.   Eyes: Positive for visual disturbance. Negative for pain, discharge and redness.  Respiratory: Negative for cough and shortness of breath.   Gastrointestinal: Negative for diarrhea, nausea and vomiting.  Musculoskeletal: Negative for arthralgias and myalgias.  Neurological: Positive for dizziness. Negative for seizures, syncope, facial asymmetry, weakness, light-headedness, numbness and headaches.    Allergies  Amoxicillin and Penicillins  Home Medications    Prior to Admission medications   Medication Sig Start Date End Date Taking? Authorizing Provider  esomeprazole (NEXIUM) 40 MG capsule Take 40 mg by mouth daily at 12 noon.    Historical Provider, MD  meclizine (ANTIVERT) 25 MG tablet Take 1 tablet (25 mg total) by mouth 3 (three) times daily as needed for dizziness. 02/03/16   Junius FinnerErin O'Malley, PA-C   Meds Ordered and Administered this Visit  Medications - No data to display  BP 129/88 (BP Location: Left Arm)   Pulse 70   Temp 98 F (36.7 C) (Oral)   Resp 16   Ht 5\' 9"  (1.753 m)   Wt 198 lb (89.8 kg)   SpO2 100%   BMI 29.24 kg/m  Orthostatic VS for the past 24 hrs:  BP- Lying Pulse- Lying BP- Sitting Pulse- Sitting BP- Standing at 0 minutes Pulse- Standing at 0 minutes  02/03/16 1326 136/89 67 137/85 74 130/89 71    Physical Exam  Constitutional: He is oriented to person, place, and time. He appears well-developed and well-nourished.  HENT:  Head: Normocephalic and atraumatic.  Right Ear: Tympanic membrane normal.  Left Ear: Tympanic membrane normal.  Nose: Nose normal.  Mouth/Throat: Uvula is midline, oropharynx is clear and moist and mucous membranes are normal.  Eyes: Conjunctivae and EOM are normal. Pupils are equal, round, and reactive to light. Right eye exhibits no discharge. Left eye exhibits no discharge. No scleral icterus.  Neck: Normal range of motion. Neck supple.  Cardiovascular: Normal rate and regular rhythm.   Pulmonary/Chest: Effort normal and breath sounds normal. No  respiratory distress. He has no wheezes. He has no rales.  Musculoskeletal: Normal range of motion.  Neurological: He is alert and oriented to person, place, and time. He has normal strength. No cranial nerve deficit or sensory deficit. He displays a negative Romberg sign. GCS eye subscore is 4. GCS verbal subscore is 5. GCS motor subscore is 6.  CN II-XII in tact. Speech is clear. Alert to person, place and time. Able to follow 2 step commands.  Normal finger to nose coordination. Normal gait.   Skin: Skin is warm and dry.  Psychiatric: He has a normal mood and affect. His behavior is normal.  Nursing note and vitals reviewed.   Urgent Care Course   Clinical Course    Procedures (including critical care time)  Labs Review Labs Reviewed  POCT FASTING CBG [facility] MANUAL ENTRY    Imaging Review No results found.   Visual Acuity Review  Right Eye Distance: 20/13 Left Eye Distance: 20/13 Bilateral Distance: 20/13 (uncorrected)   MDM   1. Blurred vision   2. Dizziness     Pt c/o blurred vision and dizziness, worse when turning his head side to side.   Normal neuro exam in UC.  CBG: 98 Vitals including orthostatic vital signs: WNL Visual acuity better than average: 20/13 w/o correction  Reassured pt of normal exam. Doubt SAH, CVA, meningitis, or other emergent process at this time. 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 Advised not to drink alcohol, drive, or operate heavy machinery while taking Strongly encouraged establishing care with a PCP for ongoing health maintenance and recheck of symptoms in 3-4 days if not improving. Discussed symptoms that warrant emergent care in the ED.      Junius Finnerrin O'Malley, PA-C 02/03/16 1434

## 2016-02-06 ENCOUNTER — Telehealth: Payer: Self-pay | Admitting: *Deleted

## 2016-02-06 NOTE — Telephone Encounter (Signed)
Callback: Patient reports dizziness has not improved. He saw his opthalmologist yesterday who believes it is an issue with the muscles surrounding his eye.

## 2017-09-13 ENCOUNTER — Encounter: Payer: Self-pay | Admitting: *Deleted

## 2017-09-13 ENCOUNTER — Other Ambulatory Visit: Payer: Self-pay

## 2017-09-13 ENCOUNTER — Emergency Department (INDEPENDENT_AMBULATORY_CARE_PROVIDER_SITE_OTHER)
Admission: EM | Admit: 2017-09-13 | Discharge: 2017-09-13 | Disposition: A | Discharge status: Home / Self Care | Payer: PRIVATE HEALTH INSURANCE | Source: Home / Self Care | Attending: Family Medicine | Admitting: Family Medicine

## 2017-09-13 DIAGNOSIS — S0501XA Injury of conjunctiva and corneal abrasion without foreign body, right eye, initial encounter: Secondary | ICD-10-CM | POA: Diagnosis not present

## 2017-09-13 DIAGNOSIS — T1591XA Foreign body on external eye, part unspecified, right eye, initial encounter: Secondary | ICD-10-CM | POA: Diagnosis not present

## 2017-09-13 MED ORDER — KETOROLAC TROMETHAMINE 0.5 % OP SOLN: 1.0000 [drp] | Freq: Four times a day (QID) | OPHTHALMIC | 0 refills | Status: DC

## 2017-09-13 MED ORDER — SULFACETAMIDE SODIUM 10 % OP SOLN: 1.0000 [drp] | OPHTHALMIC | 0 refills | Status: DC

## 2017-09-13 NOTE — Discharge Instructions (Addendum)
Wear sunglasses when outside 

## 2017-09-13 NOTE — ED Provider Notes (Signed)
[facility]    CSN: [id number] Arrival date & time: 09/13/17  1722     History   Chief Complaint Chief Complaint  Patient presents with  . Eye Pain    HPI Brent Roth is a 47 y.o. male.   Patient was working on his Engineer, structuraltractor engine 3 days ago when some debris from a bird's nest blew into his right eye.  He has had persistent irritation and foreign body sensation in his right eye.  The history is provided by the patient and the spouse.  Foreign Body in Eye  This is a new problem. Episode onset: 3 days ago. The problem occurs constantly. The problem has not changed since onset.Exacerbated by: eye movement, blinking, and light. Nothing relieves the symptoms. Treatments tried: eye lavage.    Past Medical History:  Diagnosis Date  . Seasonal allergies     There are no active problems to display for this patient.   History reviewed. No pertinent surgical history.     Home Medications    Prior to Admission medications   Medication Sig Start Date End Date Taking? Authorizing Provider  aspirin 81 MG chewable tablet Chew by mouth daily.   Yes [provider]  cetirizine (ZYRTEC) 10 MG tablet Take 10 mg by mouth daily.   Yes [provider]  esomeprazole (NEXIUM) 40 MG capsule Take 40 mg by mouth daily at 12 noon.   Yes [provider]  ketorolac (ACULAR) 0.5 % ophthalmic solution Place 1 drop into the right eye 4 (four) times daily. 09/13/17   Lattie HawBeese, Jeffrey Voth A, MD  sulfacetamide (BLEPH-10) 10 % ophthalmic solution Place 1-2 drops into the right eye every 3 (three) hours while awake. 09/13/17   Lattie HawBeese, Kemari Narez A, MD    Family History Family History  Problem Relation Age of Onset  . Heart failure Mother   . Heart failure Father   . Heart failure Brother     Social History Social History   Tobacco Use  . Smoking status: Former Games developermoker  . Smokeless tobacco: Never Used  Substance Use Topics  . Alcohol use: Yes  . Drug use: No      Allergies   Amoxicillin and Penicillins   Review of Systems Review of Systems  All other systems reviewed and are negative.    Physical Exam Triage Vital Signs ED Triage Vitals  Enc Vitals Group     BP 09/13/17 1758 128/85     Pulse Rate 09/13/17 1758 65     Resp 09/13/17 1758 18     Temp 09/13/17 1758 98.6 F (37 C)     Temp Source 09/13/17 1758 Oral     SpO2 09/13/17 1758 98 %     Weight 09/13/17 1759 195 lb (88.5 kg)     Height --      Head Circumference --      Peak Flow --      Pain Score 09/13/17 1758 0     Pain Loc --      Pain Edu? --      Excl. in GC? --    No data found.  Updated Vital Signs BP 128/85 (BP Location: Right Arm)   Pulse 65   Temp 98.6 F (37 C) (Oral)   Resp 18   Wt 195 lb (88.5 kg)   SpO2 98%   BMI 28.80 kg/m   Visual Acuity Right Eye Distance: 20/20 Left Eye Distance: 20/20 Bilateral Distance: 20/20(w/o correction)  Right Eye Near:  Left Eye Near:    Bilateral Near:     Physical Exam  Constitutional: He appears well-developed and well-nourished. No distress.  HENT:  Head: Normocephalic and atraumatic.  Right Ear: External ear normal.  Left Ear: External ear normal.  Nose: Nose normal.  Mouth/Throat: Oropharynx is clear and moist.  Eyes: Pupils are equal, round, and reactive to light. EOM are normal. Right eye exhibits no chemosis, no discharge, no exudate and no hordeolum. Foreign body present in the right eye. Right conjunctiva is injected.     Mild right photophobia present.  Right lid eversion reveals a tiny foreign body about 0.38mm long.  Fluorescein to right eye reveals conjunctival abrasion as noted on diagram.   Nursing note and vitals reviewed.    UC Treatments / Results  Labs (all labs ordered are listed, but only abnormal results are displayed) Labs Reviewed - No data to display  EKG None Radiology No results found.  Procedures Procedures Foreign body removal right eye:  Instilled one drop  tetracaine ophthalmic solution to right eye.  Easily removed tiny foreign body with cotton tip applicator  Medications Ordered in UC Medications - No data to display   Initial Impression / Assessment and Plan / UC Course  I have reviewed the triage vital signs and the nursing notes.  Pertinent labs & imaging results that were available during my care of the patient were reviewed by me and considered in my medical decision making (see chart for details).    Begin sulfacetamide ophthalmic suspension and Acular suspension. Wear sunglasses when outside. Followup with ophthalmologist if not improved 3 days.    Final Clinical Impressions(s) / UC Diagnoses   Final diagnoses:  Foreign body of right eye, initial encounter  Conjunctival abrasion, right, initial encounter    ED Discharge Orders        Ordered    ketorolac (ACULAR) 0.5 % ophthalmic solution  4 times daily     09/13/17 1844    sulfacetamide (BLEPH-10) 10 % ophthalmic solution  Every  3 hours while awake     09/13/17 1844          Lattie Haw, MD 09/13/17 1955

## 2017-09-13 NOTE — ED Triage Notes (Signed)
Pt c/o RT eye irritation x 3 days after having debris blown into his face while working on his tractor.

## 2020-01-08 ENCOUNTER — Emergency Department (INDEPENDENT_AMBULATORY_CARE_PROVIDER_SITE_OTHER)
Admission: EM | Admit: 2020-01-08 | Discharge: 2020-01-08 | Disposition: A | Discharge status: Home / Self Care | Payer: PRIVATE HEALTH INSURANCE | Source: Home / Self Care | Attending: Family Medicine | Admitting: Family Medicine

## 2020-01-08 ENCOUNTER — Emergency Department (INDEPENDENT_AMBULATORY_CARE_PROVIDER_SITE_OTHER): Payer: PRIVATE HEALTH INSURANCE

## 2020-01-08 ENCOUNTER — Other Ambulatory Visit: Payer: Self-pay

## 2020-01-08 DIAGNOSIS — R1011 Right upper quadrant pain: Secondary | ICD-10-CM | POA: Diagnosis not present

## 2020-01-08 DIAGNOSIS — R6881 Early satiety: Secondary | ICD-10-CM

## 2020-01-08 DIAGNOSIS — R1010 Upper abdominal pain, unspecified: Secondary | ICD-10-CM

## 2020-01-08 LAB — POCT URINALYSIS DIP (MANUAL ENTRY)
Bilirubin, UA: NEGATIVE
Blood, UA: NEGATIVE
Glucose, UA: NEGATIVE mg/dL
Ketones, POC UA: NEGATIVE mg/dL
Leukocytes, UA: NEGATIVE
Nitrite, UA: NEGATIVE
Protein Ur, POC: NEGATIVE mg/dL
Spec Grav, UA: 1.02 (ref 1.010–1.025)
Urobilinogen, UA: 0.2 E.U./dL
pH, UA: 5.5 (ref 5.0–8.0)

## 2020-01-08 LAB — POCT CBC W AUTO DIFF (K'VILLE URGENT CARE)

## 2020-01-08 NOTE — ED Triage Notes (Addendum)
Pt c/o RT sided chest/rib pain x 2 mos. No known injury. No heat/ice tried or OTC meds taken. Pain 1/10. Wife encouraged him to get checked out.

## 2020-01-08 NOTE — ED Provider Notes (Signed)
[facility]    CSN: [id number] Arrival date & time: 01/08/20  1707      History   Chief Complaint Chief Complaint  Patient presents with  . Chest Pain    Rib, RT    HPI Brent Roth is a 49 y.o. male.   Patient complains of approximately 3 month history of generally constant mild vague right anterior/lateral/inferior rib pain (he admits that his wife persuaded him to be evaluated).  He recalls no injury and denies cough, shortness of breath, and pleuritic pain.  He admits to having early satiety and mildly decreased appetite but denies nausea/vomiting, changes in bowel movements, and weight loss.  He feels that there is some enlargement of his right upper abdomen that he can visualize when standing.  He admits to occasional alcohol intake, but not to excess.     The history is provided by the patient.    Past Medical History:  Diagnosis Date  . Seasonal allergies     There are no problems to display for this patient.   History reviewed. No pertinent surgical history.     Home Medications    Prior to Admission medications   Medication Sig Start Date End Date Taking? Authorizing Provider  aspirin 81 MG chewable tablet Chew by mouth daily.    [provider]  cetirizine (ZYRTEC) 10 MG tablet Take 10 mg by mouth daily.    [provider]  esomeprazole (NEXIUM) 40 MG capsule Take 40 mg by mouth daily at 12 noon.    [provider]  ketorolac (ACULAR) 0.5 % ophthalmic solution Place 1 drop into the right eye 4 (four) times daily. 09/13/17   Lattie Haw, MD  sulfacetamide (BLEPH-10) 10 % ophthalmic solution Place 1-2 drops into the right eye every 3 (three) hours while awake. 09/13/17   Lattie Haw, MD    Family History Family History  Problem Relation Age of Onset  . Heart failure Mother   . Heart failure Father   . Heart failure Brother     Social History Social History   Tobacco Use  . Smoking status: Former Games developer    . Smokeless tobacco: Never Used  Vaping Use  . Vaping Use: Never used  Substance Use Topics  . Alcohol use: Yes  . Drug use: No     Allergies   Amoxicillin and Penicillins   Review of Systems Review of Systems  Constitutional: Positive for appetite change. Negative for activity change, chills, diaphoresis, fatigue, fever and unexpected weight change.  HENT: Negative.   Eyes: Negative.   Respiratory: Negative for cough, chest tightness, shortness of breath and wheezing.   Cardiovascular: Negative for chest pain, palpitations and leg swelling.  Gastrointestinal: Positive for abdominal distention. Negative for abdominal pain, blood in stool, constipation, diarrhea, nausea and vomiting.  Genitourinary: Negative.   Musculoskeletal: Negative.   Skin: Negative.   Neurological: Negative.   Hematological: Negative.      Physical Exam Triage Vital Signs ED Triage Vitals  Enc Vitals Group     BP 01/08/20 1718 123/78     Pulse Rate 01/08/20 1718 66     Resp 01/08/20 1718 18     Temp 01/08/20 1718 98.6 F (37 C)     Temp Source 01/08/20 1718 Oral     SpO2 01/08/20 1718 98 %     Weight 01/08/20 1721 199 lb (90.3 kg)     Height 01/08/20 1721 5\' 9"  (1.753 m)  Head Circumference --      Peak Flow --      Pain Score 01/08/20 1720 1     Pain Loc --      Pain Edu? --      Excl. in GC? --    No data found.  Updated Vital Signs BP 123/78 (BP Location: Left Arm)   Pulse 66   Temp 98.6 F (37 C) (Oral)   Resp 18   Ht 5\' 9"  (1.753 m)   Wt 90.3 kg   SpO2 98%   BMI 29.39 kg/m   Visual Acuity Right Eye Distance:   Left Eye Distance:   Bilateral Distance:    Right Eye Near:   Left Eye Near:    Bilateral Near:     Physical Exam Vitals and nursing note reviewed.  Constitutional:      General: He is not in acute distress.    Appearance: He is not ill-appearing.  HENT:     Head: Normocephalic.     Right Ear: External ear normal.     Left Ear: External ear normal.      Nose: Nose normal.     Mouth/Throat:     Mouth: Mucous membranes are moist.     Pharynx: Oropharynx is clear.  Eyes:     Conjunctiva/sclera: Conjunctivae normal.     Pupils: Pupils are equal, round, and reactive to light.  Cardiovascular:     Rate and Rhythm: Normal rate and regular rhythm.     Heart sounds: Normal heart sounds.  Pulmonary:     Breath sounds: Normal breath sounds.     Comments: Note that there is no tenderness to palpation in right lower chest as perceived by patient. Chest:     Chest wall: No tenderness.  Abdominal:     General: Abdomen is flat. Bowel sounds are normal. There is no distension.     Palpations: Abdomen is soft. There is hepatomegaly. There is no splenomegaly.     Tenderness: There is no abdominal tenderness.     Hernia: No hernia is present.       Comments: Liver edge palpated beneath right costal margin but not tender to palpation (liver edge more obvious when patient stands).  Musculoskeletal:     Cervical back: Neck supple.     Right lower leg: No edema.     Left lower leg: No edema.  Lymphadenopathy:     Cervical: No cervical adenopathy.  Skin:    General: Skin is warm and dry.     Findings: No rash.  Neurological:     Mental Status: He is alert and oriented to person, place, and time.      UC Treatments / Results  Labs (all labs ordered are listed, but only abnormal results are displayed) Labs Reviewed  COMPLETE METABOLIC PANEL WITH GFR  AMYLASE  LIPASE  POCT CBC W AUTO DIFF ([facility]):  WBC 8.1; LY 33.9; MO 7.2; GR 58.9; Hgb 15.7; Platelets 208   POCT URINALYSIS DIP (MANUAL ENTRY) POCT urinalysis dipstick  Collected: 01/08/20 1904  Final result  Specimen: Vein      Color, UA yellow  Clarity, UA clear  Glucose, UA negative mg/dL  Bilirubin, UA negative  Ketones, POC UA negative mg/dL  Spec Grav, UA 01/10/20  Blood, UA negative  pH, UA 5.5  Protein Ur, POC negative mg/dL  Urobilinogen, UA 0.2 E.U./dL   Nitrite, UA Negative  Leukocytes, UA Negative  EKG   Radiology DG Chest 2 View  Result Date: 01/08/2020 CLINICAL DATA:  Right upper abdominal pain for 3 months EXAM: CHEST - 2 VIEW COMPARISON:  August 14, 2014 FINDINGS: The heart size and mediastinal contours are within normal limits. Both lungs are clear. The visualized skeletal structures are unremarkable. IMPRESSION: No active cardiopulmonary disease. Electronically Signed   By: Jonna Clark M.D.   On: 01/08/2020 19:38    Procedures Procedures (including critical care time)  Medications Ordered in UC Medications - No data to display  Initial Impression / Assessment and Plan / UC Course  I have reviewed the triage vital signs and the nursing notes.  Pertinent labs & imaging results that were available during my care of the patient were reviewed by me and considered in my medical decision making (see chart for details).    Patient's hepatomegaly with minimal discomfort and early satiety concerning for undiagnosed liver disease. Normal urinalysis and CBC reassuring.  Note negative chest x-ray CMP, amylase, lipase pending. Will refer to PCP for further evaluation Final Clinical Impressions(s) / UC Diagnoses   Final diagnoses:  Abdominal pain, right upper quadrant  Early satiety   Discharge Instructions   None    ED Prescriptions    None        Lattie Haw, MD 01/09/20 1440

## 2020-01-09 LAB — COMPLETE METABOLIC PANEL WITH GFR
AG Ratio: 1.8 (calc) (ref 1.0–2.5)
ALT: 47 U/L — ABNORMAL HIGH (ref 9–46)
AST: 30 U/L (ref 10–35)
Albumin: 4.6 g/dL (ref 3.6–5.1)
Alkaline phosphatase (APISO): 67 U/L (ref 35–144)
BUN/Creatinine Ratio: 15 (calc) (ref 6–22)
BUN: 21 mg/dL (ref 7–25)
CO2: 25 mmol/L (ref 20–32)
Calcium: 9.6 mg/dL (ref 8.6–10.3)
Chloride: 106 mmol/L (ref 98–110)
Creat: 1.36 mg/dL — ABNORMAL HIGH (ref 0.70–1.33)
GFR, Est African American: 69 mL/min/{1.73_m2} (ref >=60)
GFR, Est Non African American: 60 mL/min/{1.73_m2} (ref >=60)
Globulin: 2.5 g/dL (calc) (ref 1.9–3.7)
Glucose, Bld: 104 mg/dL — ABNORMAL HIGH (ref 65–99)
Potassium: 4 mmol/L (ref 3.5–5.3)
Sodium: 140 mmol/L (ref 135–146)
Total Bilirubin: 0.6 mg/dL (ref 0.2–1.2)
Total Protein: 7.1 g/dL (ref 6.1–8.1)

## 2020-01-09 LAB — LIPASE: Lipase: 63 U/L — ABNORMAL HIGH (ref 7–60)

## 2020-01-09 LAB — AMYLASE: Amylase: 66 U/L (ref 21–101)

## 2020-01-17 ENCOUNTER — Ambulatory Visit (INDEPENDENT_AMBULATORY_CARE_PROVIDER_SITE_OTHER): Payer: PRIVATE HEALTH INSURANCE

## 2020-01-17 ENCOUNTER — Ambulatory Visit (INDEPENDENT_AMBULATORY_CARE_PROVIDER_SITE_OTHER): Payer: PRIVATE HEALTH INSURANCE | Admitting: Medical-Surgical

## 2020-01-17 ENCOUNTER — Other Ambulatory Visit: Payer: Self-pay

## 2020-01-17 ENCOUNTER — Encounter: Payer: Self-pay | Admitting: Medical-Surgical

## 2020-01-17 VITALS — BP 113/74 | HR 71 | Temp 98.1°F | Ht 69.5 in | Wt 189.8 lb

## 2020-01-17 DIAGNOSIS — R1011 Right upper quadrant pain: Secondary | ICD-10-CM | POA: Diagnosis not present

## 2020-01-17 DIAGNOSIS — Z114 Encounter for screening for human immunodeficiency virus [HIV]: Secondary | ICD-10-CM

## 2020-01-17 DIAGNOSIS — Z23 Encounter for immunization: Secondary | ICD-10-CM | POA: Diagnosis not present

## 2020-01-17 NOTE — Progress Notes (Signed)
New Patient Office Visit  Subjective:  Patient ID: Brent Roth, male    DOB: 1971-04-15  Age: 49 y.o. MRN: [id number]  CC:  Chief Complaint  Patient presents with  . Establish Care  . Follow-up    [facility] Kathryne Sharper 01/08/2020    HPI Brent Roth presents to establish care.  Brent Roth was seen in urgent care on 01/08/2020 at his wife's urging for evaluation of a right upper quadrant bulge that has been present for the past 2 to 3 months.  Brent Roth also has experienced early satiety and some generalized malaise.  At urgent care Brent Roth was evaluated and thought to have hepatomegaly.  Lab work was drawn with no significant abnormalities to explain his symptoms.  Brent Roth is an alcohol drinker but reports Brent Roth does not drink in excess.  Presents today desiring imaging of the abdomen as discussed at urgent care.  On evaluation of PHQ/gad questionnaires, patient noted to have several symptoms of depression.  Patient denies being depressed.  His wife disagrees.  Patient is not interested in counseling or medication at this time.  Notes that Brent Roth used to enjoy things like camping and other outdoor activities but since Brent Roth is gotten older Brent Roth realizes how much work these things cause and Brent Roth would rather not do them.  Denies SI/HI but wife has heard him state that Brent Roth wants to sit in his chair and just die.  Past Medical History:  Diagnosis Date  . Allergy   . GERD (gastroesophageal reflux disease)   . Seasonal allergies     Past Surgical History:  Procedure Laterality Date  . NO PAST SURGERIES      Family History  Problem Relation Age of Onset  . Heart failure Mother   . Heart failure Father   . Prostate cancer Father   . Heart failure Brother     Social History   Socioeconomic History  . Marital status: Married    Spouse name: Not on file  . Number of children: Not on file  . Years of education: Not on file  . Highest education level: Not on file  Occupational History  . Not on file  Tobacco Use  .  Smoking status: Never Smoker  . Smokeless tobacco: Former Neurosurgeon    Types: Chew, Snuff  Vaping Use  . Vaping Use: Never used  Substance and Sexual Activity  . Alcohol use: Yes    Alcohol/week: 14.0 standard drinks    Types: 14 Standard drinks or equivalent per week  . Drug use: No  . Sexual activity: Yes    Partners: Female    Birth control/protection: Surgical  Other Topics Concern  . Not on file  Social History Narrative  . Not on file   Social Determinants of Health   Financial Resource Strain:   . Difficulty of Paying Living Expenses:   Food Insecurity:   . Worried About Programme researcher, broadcasting/film/video in the Last Year:   . Barista in the Last Year:   Transportation Needs:   . Freight forwarder (Medical):   Marland Kitchen Lack of Transportation (Non-Medical):   Physical Activity:   . Days of Exercise per Week:   . Minutes of Exercise per Session:   Stress:   . Feeling of Stress :   Social Connections:   . Frequency of Communication with Friends and Family:   . Frequency of Social Gatherings with Friends and Family:   . Attends Religious Services:   . Active Member  of Clubs or Organizations:   . Attends Banker Meetings:   Marland Kitchen Marital Status:   Intimate Partner Violence:   . Fear of Current or Ex-Partner:   . Emotionally Abused:   Marland Kitchen Physically Abused:   . Sexually Abused:     ROS Review of Systems  Constitutional: Positive for appetite change. Negative for chills, fever and unexpected weight change.  Respiratory: Negative for cough, chest tightness, shortness of breath and wheezing.   Cardiovascular: Negative for chest pain, palpitations and leg swelling.  Gastrointestinal:       Early satiety, bulge in the right upper quadrant that is not painful but is described as uncomfortable  Neurological: Negative for dizziness and headaches.  Psychiatric/Behavioral: Positive for dysphoric mood. Negative for self-injury, sleep disturbance and suicidal ideas. The patient  is not nervous/anxious.    Depression screen PHQ 2/9 01/17/2020  Decreased Interest 3  Down, Depressed, Hopeless 3  PHQ - 2 Score 6  Altered sleeping 1  Tired, decreased energy 3  Change in appetite 2  Feeling bad or failure about yourself  0  Trouble concentrating 0  Moving slowly or fidgety/restless 0  Suicidal thoughts 0  PHQ-9 Score 12  Difficult doing work/chores Very difficult   GAD 7 : Generalized Anxiety Score 01/17/2020  Nervous, Anxious, on Edge 0  Control/stop worrying 0  Worry too much - different things 0  Trouble relaxing 0  Restless 0  Easily annoyed or irritable 3  Afraid - awful might happen 0  Total GAD 7 Score 3  Anxiety Difficulty Very difficult   Objective:   Today's Vitals: BP 113/74   Pulse 71   Temp 98.1 F (36.7 C) (Oral)   Ht 5' 9.5" (1.765 m)   Wt 189 lb 12.8 oz (86.1 kg)   SpO2 96%   BMI 27.63 kg/m   Physical Exam Constitutional:      General: Brent Roth is not in acute distress.    Appearance: Normal appearance.  HENT:     Head: Normocephalic and atraumatic.  Cardiovascular:     Rate and Rhythm: Normal rate and regular rhythm.     Pulses: Normal pulses.     Heart sounds: Normal heart sounds. No murmur heard.  No friction rub. No gallop.   Pulmonary:     Effort: Pulmonary effort is normal. No respiratory distress.     Breath sounds: Normal breath sounds. No wheezing or rales.  Abdominal:     General: Bowel sounds are normal. There is no distension.     Palpations: Abdomen is soft. There is mass (Right upper quadrant bulge when in sitting position, soft and nontender).     Tenderness: There is no abdominal tenderness. There is no guarding.    Skin:    General: Skin is warm and dry.     Coloration: Skin is not jaundiced.  Neurological:     Mental Status: Brent Roth is alert and oriented to person, place, and time.  Psychiatric:        Mood and Affect: Mood normal.        Behavior: Behavior normal.        Thought Content: Thought content normal.         Judgment: Judgment normal.     Assessment & Plan:   1. Right upper quadrant abdominal pain/mass Low suspicion for hepatitis given the most recent lab work.  Possible alcoholic liver disease versus NAFLD.  Also consider possible abdominal wall hernia.  Getting a CT abdomen pelvis without  contrast for further evaluation.  In the setting of early satiety, concern for upper GI abnormality.  Recommend referral to GI for EGD and colonoscopy, patient declined.  Advised patient that depending on CT results, we may need to get him in touch with a gastroenterologist even if Brent Roth does not want to have a colonoscopy or EGD.  Patient and wife verbalized understanding and are agreeable to the plan. - CT Abdomen Pelvis Wo Contrast; Future  2.  Depressed mood Discussed possible somatic effects of untreated depression.  Reviewed available treatments including medication and counseling.  Patient is resistant at this time and would prefer to not start medications or counseling.  Advised patient should Brent Roth change his mind, please let me know and we will take the necessary steps.  Outpatient Encounter Medications as of 01/17/2020  Medication Sig  . cetirizine (ZYRTEC) 10 MG tablet Take 10 mg by mouth daily.  Marland Kitchen esomeprazole (NEXIUM) 40 MG capsule Take 40 mg by mouth daily at 12 noon.  . [DISCONTINUED] aspirin 81 MG chewable tablet Chew by mouth daily. (Patient not taking: Reported on 01/17/2020)  . [DISCONTINUED] ketorolac (ACULAR) 0.5 % ophthalmic solution Place 1 drop into the right eye 4 (four) times daily. (Patient not taking: Reported on 01/17/2020)  . [DISCONTINUED] sulfacetamide (BLEPH-10) 10 % ophthalmic solution Place 1-2 drops into the right eye every 3 (three) hours while awake. (Patient not taking: Reported on 01/17/2020)   No facility-administered encounter medications on file as of 01/17/2020.    Follow-up: Return if symptoms worsen or fail to improve.   Thayer Ohm, DNP, APRN, FNP-BC [place]  [facility]

## 2020-01-18 ENCOUNTER — Other Ambulatory Visit: Payer: Self-pay | Admitting: Medical-Surgical

## 2020-01-18 DIAGNOSIS — R6881 Early satiety: Secondary | ICD-10-CM

## 2022-04-13 IMAGING — CT CT ABD-PELV W/O CM
2 of 4 series · 16 of 46 positions shown, 18 images · non-contrast
Comparison: None.

CLINICAL DATA: Three-month history of pain in the RIGHT
anterolateral

EXAM:
CT ABDOMEN AND PELVIS WITHOUT CONTRAST
TECHNIQUE: Multidetector CT imaging of the abdomen and pelvis was performed
following the standard protocol without IV contrast.

[Series 2: axial st · axial · 0.83mm/px · z∈[-711,-206]mm · 13 of 111 slices shown, 15 images]
[im 5/111  soft-tissue]
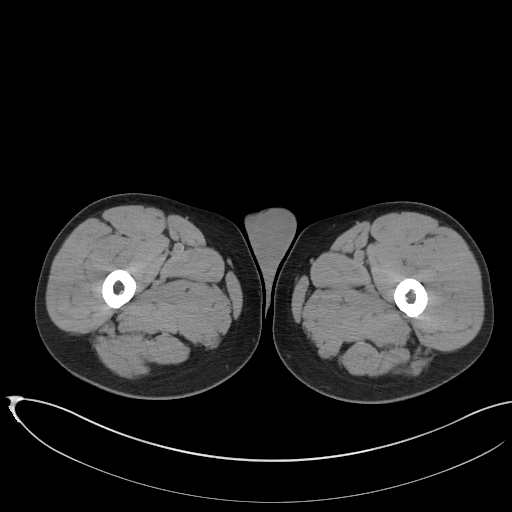
[im 5/111  bone]
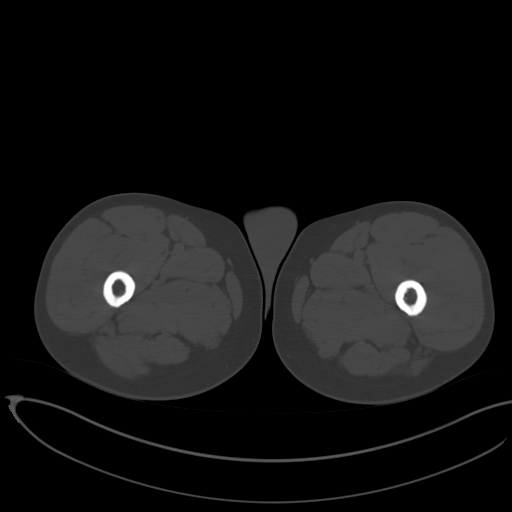
[im 14/111  soft-tissue]
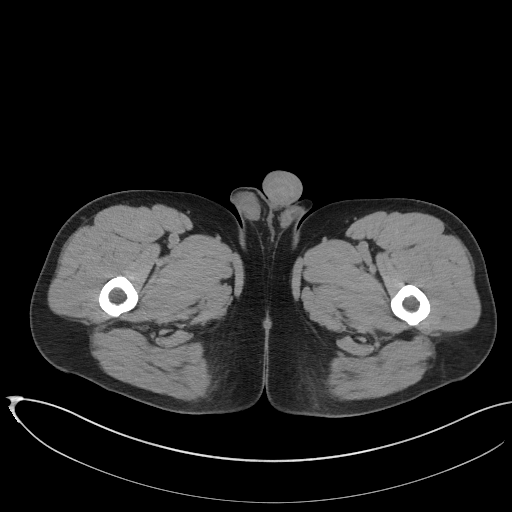
[im 23/111  soft-tissue]
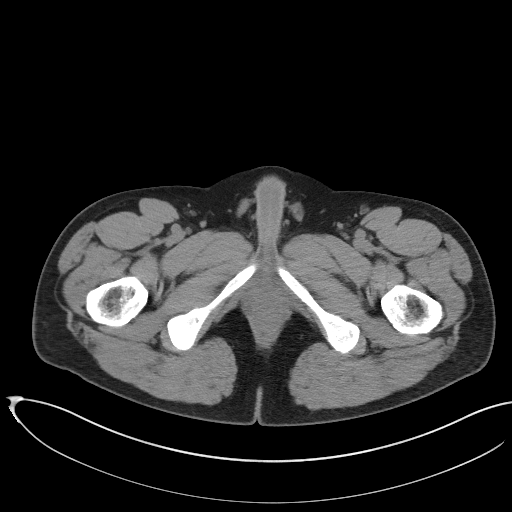
[im 31/111  soft-tissue]
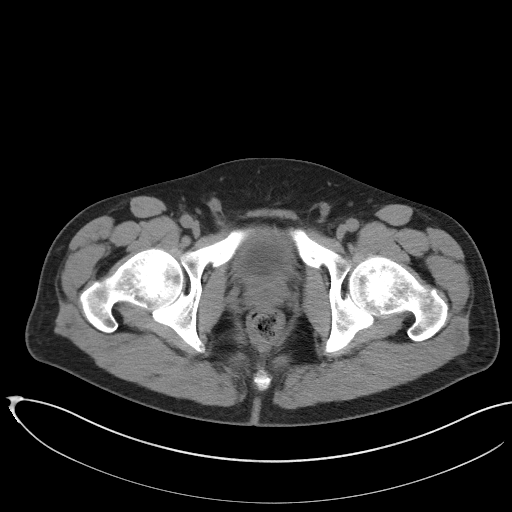
[im 40/111  soft-tissue]
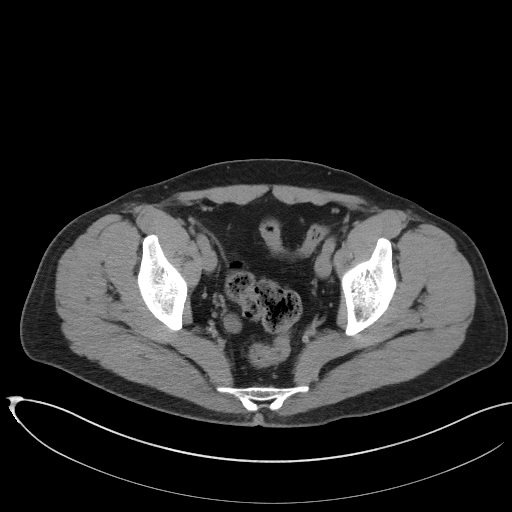
[im 49/111  soft-tissue]
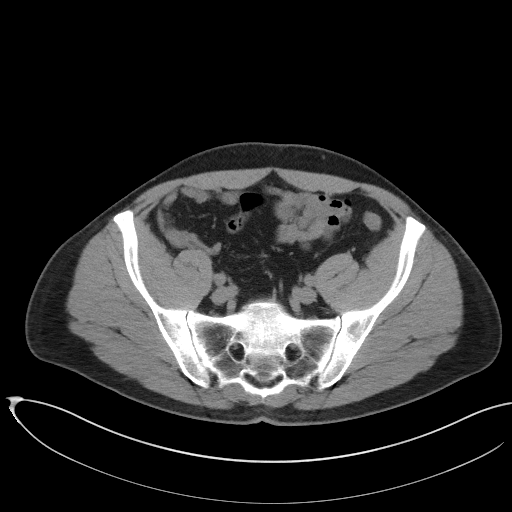
[im 58/111  soft-tissue]
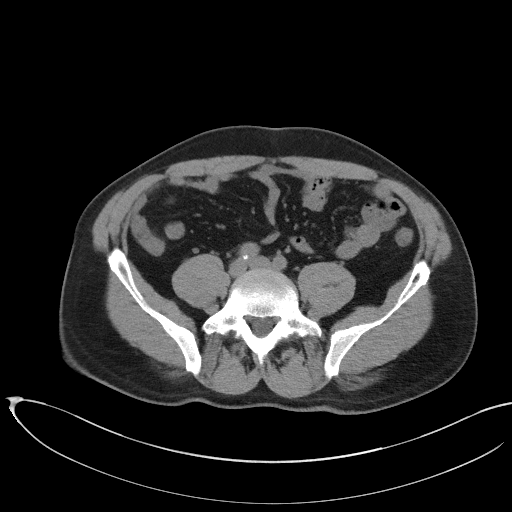
[im 62/111  soft-tissue]
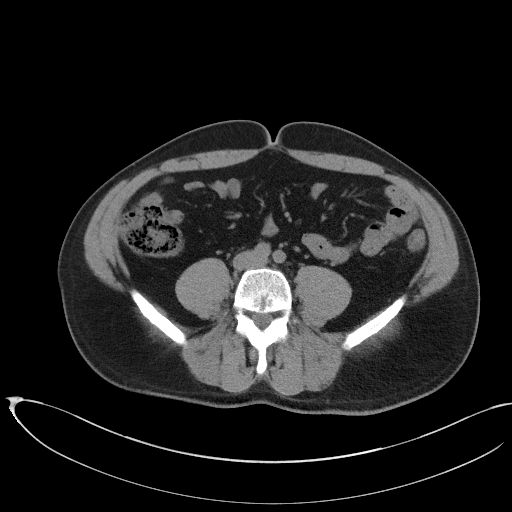
[im 71/111  soft-tissue]
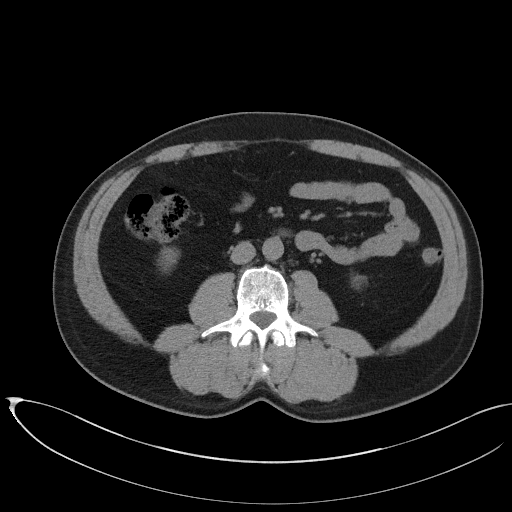
[im 71/111  bone]
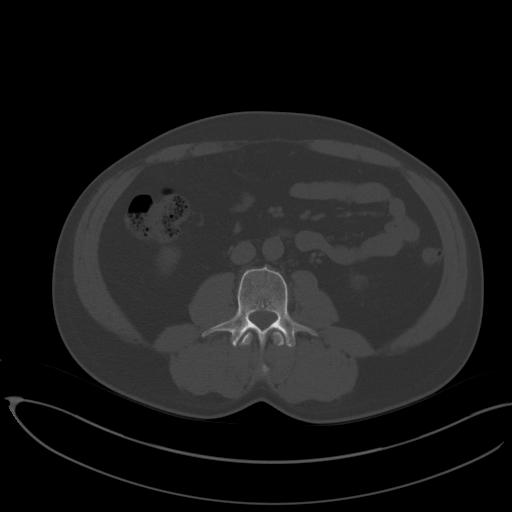
[im 80/111  soft-tissue]
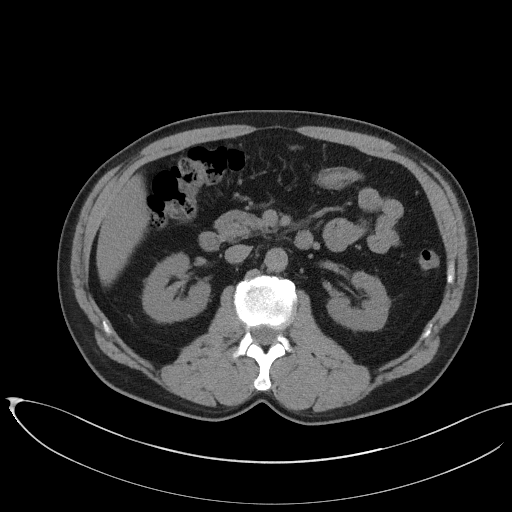
[im 89/111  soft-tissue]
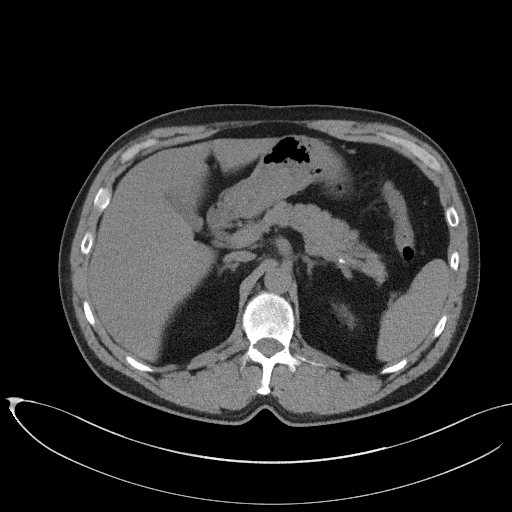
[im 97/111  soft-tissue]
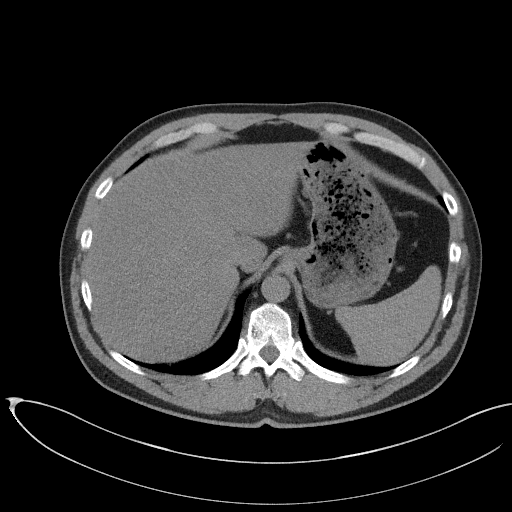
[im 106/111  soft-tissue]
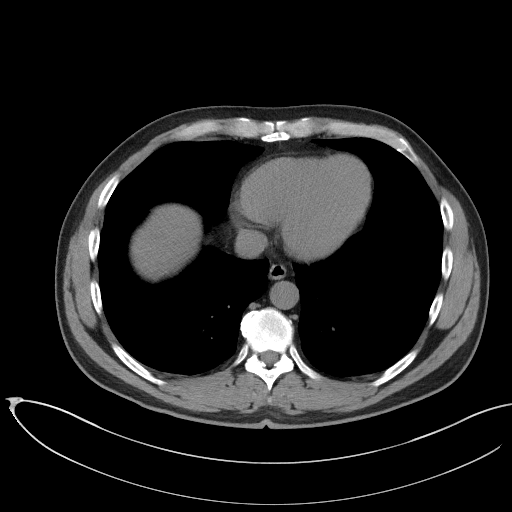

[Series 4: coronal st · coronal · 0.90mm/px · 3 of 95 slices shown]
[im 32/95  soft-tissue]
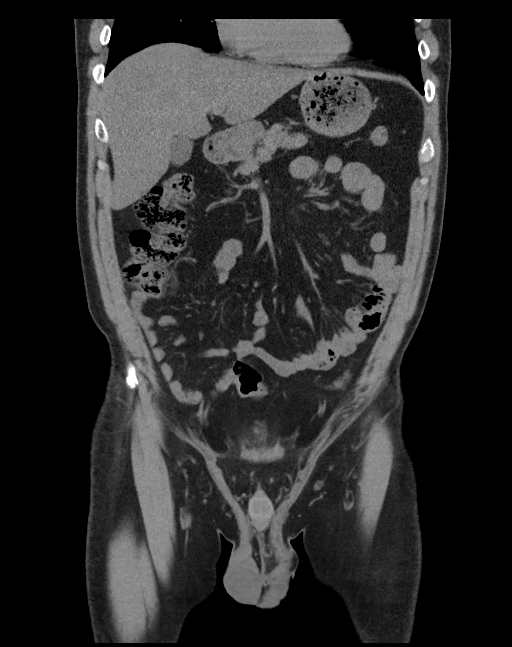
[im 42/95  soft-tissue]
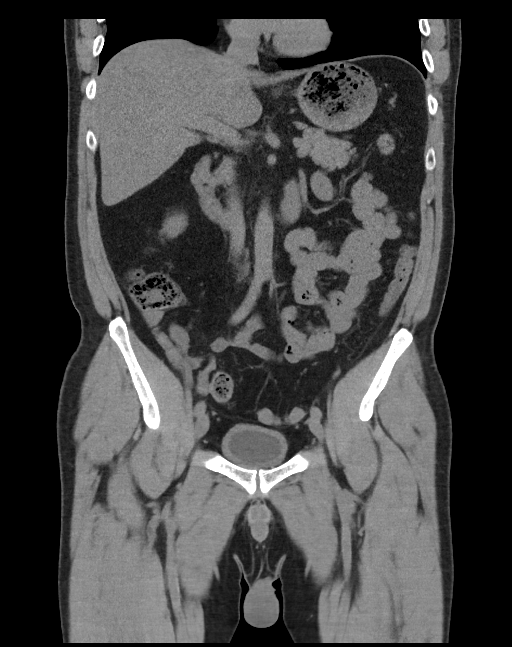
[im 53/95  soft-tissue]
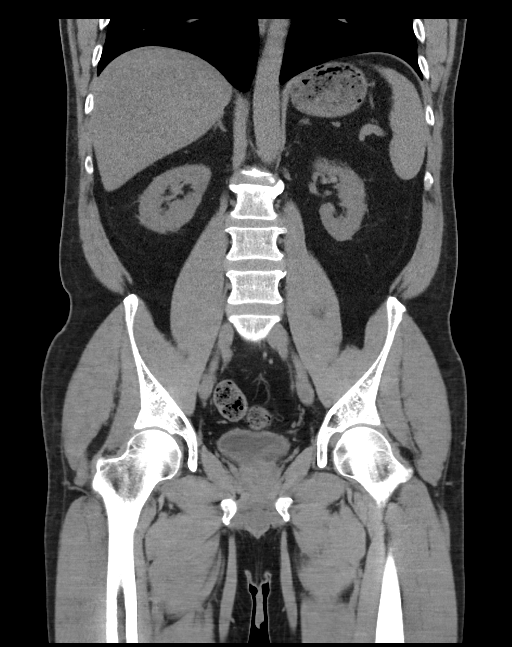

[16 of 46 positions shown; findings below may reference images not displayed]

FINDINGS: Lower chest: No acute abnormality.

Hepatobiliary: Mild hepatic steatosis with focal fatty sparing of
the gallbladder fossa. Gallbladder is unremarkable. No intrahepatic
or extrahepatic biliary ductal dilation.

Pancreas: Unremarkable. No pancreatic ductal dilatation or
surrounding inflammatory changes.

Spleen: Normal in size without focal abnormality.

Adrenals/Urinary Tract: Adrenal glands are unremarkable. Kidneys are
normal, without renal calculi, focal lesion, or hydronephrosis.
Bladder is decompressed.

Stomach/Bowel: Stomach is within normal limits. Appendix appears
normal. No evidence of bowel wall thickening, distention, or
inflammatory changes.

Vascular/Lymphatic: Minimal atherosclerotic calcifications. No
suspicious lymphadenopathy.

Reproductive: Prostate is unremarkable.

Other: No abdominal wall hernia or abnormality. No abdominopelvic
ascites.

Musculoskeletal: Sclerotic lesion of the LEFT iliac bone, likely a
bone island. Transitional anatomy with a RIGHT-sided assimilation
joint at L5-S1.
IMPRESSION: 1. No acute intra-abdominal findings to explain the patient's
symptoms.
2. Mild hepatic steatosis with focal fatty sparing of the
gallbladder fossa.

## 2022-05-21 ENCOUNTER — Encounter: Payer: Self-pay | Admitting: Emergency Medicine

## 2022-05-21 ENCOUNTER — Ambulatory Visit
Admission: EM | Admit: 2022-05-21 | Discharge: 2022-05-21 | Disposition: A | Discharge status: Home / Self Care | Payer: PRIVATE HEALTH INSURANCE | Attending: Family Medicine | Admitting: Family Medicine

## 2022-05-21 DIAGNOSIS — K122 Cellulitis and abscess of mouth: Secondary | ICD-10-CM | POA: Diagnosis not present

## 2022-05-21 LAB — POCT RAPID STREP A (OFFICE): Rapid Strep A Screen: NEGATIVE

## 2022-05-21 MED ORDER — AZITHROMYCIN 250 MG PO TABS: ORAL_TABLET | ORAL | 0 refills | Status: AC

## 2022-05-21 MED ORDER — PREDNISONE 20 MG PO TABS: ORAL_TABLET | ORAL | 0 refills | Status: AC

## 2022-05-21 NOTE — ED Triage Notes (Signed)
Patient c/o sore throat x 3 days, no other sx's.  Patient has been taken OTC Vicks for sore throat.

## 2022-05-21 NOTE — Discharge Instructions (Signed)
Try warm salt water gargles for sore throat several times daily.  May take Tylenol as needed for pain.

## 2022-05-21 NOTE — ED Provider Notes (Signed)
[facility]    CSN: [id number] Arrival date & time: 05/21/22  1512      History   Chief Complaint Chief Complaint  Patient presents with   Sore Throat    HPI Brent Roth is a 51 y.o. male.   Three days ago patient developed right side sinus congestion (now resolved) and a sore throat.  He feels well otherwise without cough, fever, fatigue, GI symptoms etc.  He feels as if there is something in his throat at times.  He has a history of GERD but states that it is well controlled.  The history is provided by the patient.    Past Medical History:  Diagnosis Date   Allergy    GERD (gastroesophageal reflux disease)    Seasonal allergies     There are no problems to display for this patient.   Past Surgical History:  Procedure Laterality Date   NO PAST SURGERIES         Home Medications    Prior to Admission medications   Medication Sig Start Date End Date Taking? Authorizing Provider  azithromycin (ZITHROMAX Z-PAK) 250 MG tablet Take 2 tabs today; then begin one tab once daily for 4 more days. 05/21/22  Yes Lattie Haw, MD  cetirizine (ZYRTEC) 10 MG tablet Take 10 mg by mouth daily.   Yes [provider]  esomeprazole (NEXIUM) 40 MG capsule Take 40 mg by mouth daily at 12 noon.   Yes [provider]  predniSONE (DELTASONE) 20 MG tablet Take one tab by mouth twice daily for 3 days, then one daily. Take with food. 05/21/22  Yes Lattie Haw, MD    Family History Family History  Problem Relation Age of Onset   Heart failure Mother    Heart failure Father    Prostate cancer Father    Heart failure Brother     Social History Social History   Tobacco Use   Smoking status: Never   Smokeless tobacco: Former    Types: Chew, Snuff    Quit date: 01/16/2010  Vaping Use   Vaping Use: Never used  Substance Use Topics   Alcohol use: Yes    Alcohol/week: 14.0 standard drinks of alcohol    Types: 14 Standard drinks or equivalent per  week   Drug use: No     Allergies   Amoxicillin and Penicillins   Review of Systems Review of Systems + sore throat No cough No pleuritic pain No wheezing + nasal congestion, resolved No post-nasal drainage No sinus pain/pressure No itchy/red eyes No earache No hemoptysis No SOB No fever/chills No nausea No vomiting No abdominal pain No diarrhea No urinary symptoms No skin rash No fatigue No myalgias No headache   Physical Exam Triage Vital Signs ED Triage Vitals  Enc Vitals Group     BP 05/21/22 1625 135/81     Pulse Rate 05/21/22 1625 64     Resp 05/21/22 1625 18     Temp 05/21/22 1625 98.3 F (36.8 C)     Temp Source 05/21/22 1625 Oral     SpO2 05/21/22 1625 98 %     Weight 05/21/22 1626 200 lb (90.7 kg)     Height 05/21/22 1626 5\' 9"  (1.753 m)     Head Circumference --      Peak Flow --      Pain Score 05/21/22 1626 7     Pain Loc --      Pain Edu? --  Excl. in GC? --    No data found.  Updated Vital Signs BP 135/81 (BP Location: Right Arm)   Pulse 64   Temp 98.3 F (36.8 C) (Oral)   Resp 18   Ht 5\' 9"  (1.753 m)   Wt 90.7 kg   SpO2 98%   BMI 29.53 kg/m   Visual Acuity Right Eye Distance:   Left Eye Distance:   Bilateral Distance:    Right Eye Near:   Left Eye Near:    Bilateral Near:     Physical Exam Nursing notes and Vital Signs reviewed. Appearance:  Patient appears stated age, and in no acute distress Eyes:  Pupils are equal, round, and reactive to light and accomodation.  Extraocular movement is intact.  Conjunctivae are not inflamed  Ears:  Canals normal.  Tympanic membranes normal.  Nose:  Normal turbinates.  No sinus tenderness.  Pharynx:   Uvula erythematous and mildly swollen. Neck:  Supple.  No adenopathy.  Lungs:  Clear to auscultation.  Breath sounds are equal.  Moving air well. Heart:  Regular rate and rhythm without murmurs, rubs, or gallops.  Abdomen:  Nontender .  Extremities:  No edema.  Skin:  No rash  present.   UC Treatments / Results  Labs (all labs ordered are listed, but only abnormal results are displayed) Labs Reviewed  POCT RAPID STREP A (OFFICE) negative    EKG   Radiology No results found.  Procedures Procedures (including critical care time)  Medications Ordered in UC Medications - No data to display  Initial Impression / Assessment and Plan / UC Course  I have reviewed the triage vital signs and the nursing notes.  Pertinent labs & imaging results that were available during my care of the patient were reviewed by me and considered in my medical decision making (see chart for details).    Begin Z-pack and prednisone burst/taper. Followup with Family Doctor if not improved in one week.   Final Clinical Impressions(s) / UC Diagnoses   Final diagnoses:  Uvulitis     Discharge Instructions      Try warm salt water gargles for sore throat several times daily.  May take Tylenol as needed for pain.     ED Prescriptions     Medication Sig Dispense Auth. Provider   azithromycin (ZITHROMAX Z-PAK) 250 MG tablet Take 2 tabs today; then begin one tab once daily for 4 more days. 6 tablet , MD   predniSONE (DELTASONE) 20 MG tablet Take one tab by mouth twice daily for 3 days, then one daily. Take with food. 9 tablet Lattie Haw, MD         Lattie Haw, MD 05/23/22 [phone]

## 2022-05-22 ENCOUNTER — Telehealth: Payer: Self-pay | Admitting: Emergency Medicine

## 2022-05-22 NOTE — Telephone Encounter (Signed)
Call by this RN to [facility]- patient started z-pak last night and took today's dose. Pt states he feels a little better.
